# Patient Record
Sex: Female | Born: 1965 | Race: Black or African American | Hispanic: No
Health system: Southern US, Community
[De-identification: ages and names within clinical notes are randomized; demographics above are authoritative.]

## PROBLEM LIST (undated history)

## (undated) VITALS — BP 118/76 | HR 98 | Temp 99.3°F | Resp 18 | Ht 65.0 in | Wt 129.0 lb

## (undated) DIAGNOSIS — F319 Bipolar disorder, unspecified: Secondary | ICD-10-CM

## (undated) DIAGNOSIS — D649 Anemia, unspecified: Secondary | ICD-10-CM

## (undated) DIAGNOSIS — M539 Dorsopathy, unspecified: Secondary | ICD-10-CM

## (undated) DIAGNOSIS — F209 Schizophrenia, unspecified: Secondary | ICD-10-CM

## (undated) DIAGNOSIS — F329 Major depressive disorder, single episode, unspecified: Secondary | ICD-10-CM

## (undated) DIAGNOSIS — F419 Anxiety disorder, unspecified: Secondary | ICD-10-CM

## (undated) DIAGNOSIS — F32A Depression, unspecified: Secondary | ICD-10-CM

## (undated) DIAGNOSIS — F1410 Cocaine abuse, uncomplicated: Secondary | ICD-10-CM

## (undated) HISTORY — DX: Major depressive disorder, single episode, unspecified: F32.9

## (undated) HISTORY — DX: Depression, unspecified: F32.A

## (undated) HISTORY — DX: Anemia, unspecified: D64.9

## (undated) HISTORY — DX: Anxiety disorder, unspecified: F41.9

---

## 1990-09-11 HISTORY — PX: TUBAL LIGATION: SHX77

## 2010-08-10 ENCOUNTER — Emergency Department (HOSPITAL_COMMUNITY)
Admission: EM | Admit: 2010-08-10 | Discharge: 2010-08-10 | Payer: Self-pay | Source: Home / Self Care | Admitting: Emergency Medicine

## 2010-09-22 ENCOUNTER — Ambulatory Visit
Admission: RE | Admit: 2010-09-22 | Discharge: 2010-09-22 | Payer: Self-pay | Source: Home / Self Care | Attending: Obstetrics and Gynecology | Admitting: Obstetrics and Gynecology

## 2010-09-22 ENCOUNTER — Encounter: Payer: Self-pay | Admitting: Obstetrics & Gynecology

## 2010-09-22 ENCOUNTER — Other Ambulatory Visit
Admission: RE | Admit: 2010-09-22 | Discharge: 2010-09-22 | Payer: Self-pay | Source: Home / Self Care | Admitting: Obstetrics and Gynecology

## 2010-09-22 LAB — CONVERTED CEMR LAB
HCT: 34.4 % — ABNORMAL LOW (ref 36.0–46.0)
HCV Ab: NEGATIVE
HIV: NONREACTIVE
Hemoglobin: 11.1 g/dL — ABNORMAL LOW (ref 12.0–15.0)
Hepatitis B Surface Ag: NEGATIVE
MCHC: 32.3 g/dL (ref 30.0–36.0)
MCV: 77.3 fL — ABNORMAL LOW (ref 78.0–100.0)
Platelets: 280 10*3/uL (ref 150–400)
Prolactin: 9.1 ng/mL
RBC: 4.45 M/uL (ref 3.87–5.11)
RDW: 17.6 % — ABNORMAL HIGH (ref 11.5–15.5)
TSH: 1.389 microintl units/mL (ref 0.350–4.500)
WBC: 5.8 10*3/uL (ref 4.0–10.5)

## 2010-09-23 ENCOUNTER — Ambulatory Visit (HOSPITAL_COMMUNITY)
Admission: RE | Admit: 2010-09-23 | Discharge: 2010-09-23 | Payer: Self-pay | Source: Home / Self Care | Attending: Obstetrics & Gynecology | Admitting: Obstetrics & Gynecology

## 2010-09-26 LAB — POCT URINALYSIS DIPSTICK
Bilirubin Urine: NEGATIVE
Hgb urine dipstick: NEGATIVE
Ketones, ur: NEGATIVE mg/dL
Nitrite: NEGATIVE
Protein, ur: NEGATIVE mg/dL
Specific Gravity, Urine: 1.015 (ref 1.005–1.030)
Urine Glucose, Fasting: NEGATIVE mg/dL
Urobilinogen, UA: 0.2 mg/dL (ref 0.0–1.0)
pH: 7 (ref 5.0–8.0)

## 2010-09-26 LAB — POCT PREGNANCY, URINE: Preg Test, Ur: NEGATIVE

## 2010-10-04 ENCOUNTER — Ambulatory Visit (HOSPITAL_COMMUNITY)
Admission: RE | Admit: 2010-10-04 | Discharge: 2010-10-04 | Payer: Self-pay | Source: Home / Self Care | Attending: Internal Medicine | Admitting: Internal Medicine

## 2010-10-07 ENCOUNTER — Ambulatory Visit
Admission: RE | Admit: 2010-10-07 | Discharge: 2010-10-07 | Payer: Self-pay | Source: Home / Self Care | Attending: Obstetrics and Gynecology | Admitting: Obstetrics and Gynecology

## 2010-10-08 NOTE — H&P (Signed)
NAMEALIVEAH, GALLANT NO.:  [id number]  MEDICAL RECORD NO.:  [id number]          PATIENT TYPE:  WOC  LOCATION:  [facility]                   FACILITY[facility]  [facility]  PHYSICIAN:  Catalina Antigua, MD     DATE OF BIRTH:  12-12-1965  DATE OF SERVICE:  10/07/2010                          PRE-OP HISTORY & PHYSICAL  This is a 45 year old, G7, P6-0-1-6 with a longstanding history of dysfunctional uterine bleeding who presented today for further management.  The patient states that she normally gets a period every month lasting 6-7 days, but also reports intermenstrual bleeding at times, accompanied with cramping pain.  The patient had an endometrial biopsy performed on September 22, 2010, which demonstrated a secretory endometrium with no evidence of hyperplasia.  She had an ultrasound biopsy performed on September 23, 2010, which demonstrated a uterus measuring 10 x 6 x 7 cm with thickened endometrium with a region measuring 2.5 x 1.2 x 1.8 cm suggestive of endometrial polyp as there was intraluminal fluid present within the endometrial cavity.  There is a small focal fibroid intramurally measuring 1.3 x 1.4 x 1.4 cm.  She had normal right and left-appearing ovaries.  The patient was informed of these results and desired surgical intervention.  PAST MEDICAL HISTORY:  Significant for asthma which is exertional, bronchitis and depression for which she is seeking psychiatric help.  PAST SURGICAL HISTORY:  She has had bilateral tubal ligation.  PAST OBSTETRICAL HISTORY:  She has had 6 full-term vaginal deliveries.  PAST GYNECOLOGIC HISTORY:  She denies any cyst or fibroids or any history of abnormal Pap smear.  SOCIAL HISTORY:  She smokes half a pack a day and she has been smoking since the age of 45.  She reports ongoing marijuana use and she is a former crack user approximately 40 years ago with her last consumption and she denies any alcohol abuse.  FAMILY HISTORY:   Significant for diabetes, heart disease and high blood pressure and a grandmother with pancreatic cancer.  REVIEW OF SYSTEMS:  Otherwise, significant for vasomotor symptoms.  PHYSICAL EXAMINATION:  VITAL SIGNS:  Her blood pressure is 127/86, pulse of 76, weight of 89 kg, height of 65 inches. LUNGS:  Clear to auscultation bilaterally. HEART:  Regular rate and rhythm. ABDOMEN:  Soft, nontender and nondistended. PELVIC:  She had normal external genitalia, normal-appearing vaginal mucosa and cervix.  No abnormal bleeding or discharge.  Bimanual exam, she has approximately 10-week size uterus.  No palpable adnexal masses or tenderness.  ASSESSMENT AND PLAN:  This is a 45 year old, para 6 with history of dysfunctional uterine bleeding.  Results of the endometrial biopsy and ultrasounds were reviewed with the patient.  The patient is interested in surgical intervention.  Medical options were offered.  The patient was counseled regarding endometrial ablation and is interested in proceeding with that intervention.  Risks, benefits and alternatives were explained.  The patient verbalized understanding.  The patient will be scheduled for endometrial ablation using the NovaSure.          ______________________________ Catalina Antigua, MD    PC/MEDQ  D:  10/07/2010  T:  10/08/2010  Job:  [id number]

## 2010-11-02 ENCOUNTER — Encounter (HOSPITAL_COMMUNITY)
Admission: RE | Admit: 2010-11-02 | Discharge: 2010-11-02 | Disposition: A | Discharge status: Home / Self Care | Payer: Medicaid Other | Source: Ambulatory Visit | Attending: Obstetrics and Gynecology | Admitting: Obstetrics and Gynecology

## 2010-11-02 DIAGNOSIS — Z01812 Encounter for preprocedural laboratory examination: Secondary | ICD-10-CM | POA: Insufficient documentation

## 2010-11-02 DIAGNOSIS — Z01818 Encounter for other preprocedural examination: Secondary | ICD-10-CM | POA: Insufficient documentation

## 2010-11-02 LAB — CBC
HCT: 37.8 % (ref 36.0–46.0)
Hemoglobin: 12.1 g/dL (ref 12.0–15.0)
MCH: 25.1 pg — ABNORMAL LOW (ref 26.0–34.0)
MCHC: 32 g/dL (ref 30.0–36.0)
MCV: 78.3 fL (ref 78.0–100.0)
Platelets: 268 10*3/uL (ref 150–400)
RBC: 4.83 MIL/uL (ref 3.87–5.11)
RDW: 17.2 % — ABNORMAL HIGH (ref 11.5–15.5)
WBC: 5 10*3/uL (ref 4.0–10.5)

## 2010-11-09 ENCOUNTER — Other Ambulatory Visit: Payer: Self-pay | Admitting: Obstetrics and Gynecology

## 2010-11-09 ENCOUNTER — Ambulatory Visit (HOSPITAL_COMMUNITY)
Admission: RE | Admit: 2010-11-09 | Discharge: 2010-11-09 | Disposition: A | Discharge status: Home / Self Care | Payer: Medicaid Other | Source: Ambulatory Visit | Attending: Obstetrics and Gynecology | Admitting: Obstetrics and Gynecology

## 2010-11-09 DIAGNOSIS — N925 Other specified irregular menstruation: Secondary | ICD-10-CM

## 2010-11-09 DIAGNOSIS — N938 Other specified abnormal uterine and vaginal bleeding: Secondary | ICD-10-CM | POA: Insufficient documentation

## 2010-11-09 DIAGNOSIS — N949 Unspecified condition associated with female genital organs and menstrual cycle: Secondary | ICD-10-CM | POA: Insufficient documentation

## 2010-11-09 DIAGNOSIS — Z01812 Encounter for preprocedural laboratory examination: Secondary | ICD-10-CM | POA: Insufficient documentation

## 2010-11-09 LAB — PREGNANCY, URINE: Preg Test, Ur: NEGATIVE

## 2010-11-17 NOTE — Op Note (Signed)
  Samantha Nash, Samantha Nash                ACCOUNT NO.:  [id number]  MEDICAL RECORD NO.:  [id number]           PATIENT TYPE:  O  LOCATION:  [facility]                          [facility]  PHYSICIAN:  Catalina Antigua, MD     DATE OF BIRTH:  1966-06-03  DATE OF PROCEDURE:  11/09/2010 DATE OF DISCHARGE:                              OPERATIVE REPORT   PREOPERATIVE DIAGNOSIS:  A 45 year old G7, P6 with history of dysfunctional uterine bleeding.  POSTOPERATIVE DIAGNOSIS:  A 45 year old G7, P6 with history of dysfunctional uterine bleeding.  PROCEDURE:  D and C hysteroscopy, attempted endometrial ablation.  SURGEON:  Catalina Antigua, MD  ASSISTANT:  None.  ANESTHESIA:  General.  IV FLUIDS:  1200 mL.  ESTIMATED BLOOD LOSS:  Minimal.  Specimen collected were endometrial curettings, sent to Pathology.   FINDINGS: uterine cavity sounded to 6.5 cm, narrow uterine cavity with a long cervix was visualized, approximately 1.5 cm intrauterine polyp, mass was also seen, both ostia were visualized.   PROCEDURE: After informed consent was obtained, the patient was taken to the operating room where anesthesia was induced and found to be adequate.  The patient was placed in dorsal lithotomy position and prepped and draped in usual sterile fashion and a speculum was placed in the vagina.  Anterior lip of the cervix was grasped with a single-tooth tenaculum.  The cervix was injected in the circumference of a 1% Nesacaine solution.  The cervix was gently dilated to 4.0 mm through serial insertion of Hegar dilators The hysteroscope was introduced into the uterine cavity with the above- noted findings.  The size #2 Sims curette was then used to perform gentle curettage removing the polypoid mass.  The cervix was further dilated to 8 mm through serial insertion of Hegar dilators.  The NovaSure device was inserted but failed to completely deploy after multiple attempts at repositioning.  The NovaSure  continued to fail to deploy completely.  Endometrial ablation was then aborted.  All instruments were removed from the patient's abdomen.  The patient tolerated the procedure well.  Sponge, lap, and needle count were correct x2.    Catalina Antigua, MD    PC/MEDQ  D:  11/09/2010  T:  11/09/2010  Job:  [id number]  Electronically Signed by Catalina Antigua  on 11/16/2010 12:58:27 PM

## 2010-11-21 ENCOUNTER — Ambulatory Visit: Payer: Medicaid Other | Admitting: Obstetrics and Gynecology

## 2010-11-21 ENCOUNTER — Encounter (INDEPENDENT_AMBULATORY_CARE_PROVIDER_SITE_OTHER): Payer: Self-pay | Admitting: *Deleted

## 2010-11-21 DIAGNOSIS — N925 Other specified irregular menstruation: Secondary | ICD-10-CM

## 2010-11-21 DIAGNOSIS — N949 Unspecified condition associated with female genital organs and menstrual cycle: Secondary | ICD-10-CM

## 2010-11-21 DIAGNOSIS — Z09 Encounter for follow-up examination after completed treatment for conditions other than malignant neoplasm: Secondary | ICD-10-CM

## 2010-11-21 LAB — CONVERTED CEMR LAB
HCT: 35 % — ABNORMAL LOW (ref 36.0–46.0)
Hemoglobin: 11.6 g/dL — ABNORMAL LOW (ref 12.0–15.0)
MCHC: 33.1 g/dL (ref 30.0–36.0)
MCV: 77.1 fL — ABNORMAL LOW (ref 78.0–100.0)
Platelets: 261 10*3/uL (ref 150–400)
RBC: 4.54 M/uL (ref 3.87–5.11)
RDW: 16.9 % — ABNORMAL HIGH (ref 11.5–15.5)
WBC: 4.5 10*3/uL (ref 4.0–10.5)

## 2010-11-23 LAB — COMPREHENSIVE METABOLIC PANEL
ALT: 11 U/L (ref 0–35)
AST: 19 U/L (ref 0–37)
Albumin: 3.3 g/dL — ABNORMAL LOW (ref 3.5–5.2)
Alkaline Phosphatase: 69 U/L (ref 39–117)
BUN: 6 mg/dL (ref 6–23)
CO2: 26 mEq/L (ref 19–32)
Calcium: 8.5 mg/dL (ref 8.4–10.5)
Chloride: 100 mEq/L (ref 96–112)
Creatinine, Ser: 0.85 mg/dL (ref 0.4–1.2)
GFR calc Af Amer: >60 mL/min (ref >60)
GFR calc non Af Amer: >60 mL/min (ref >60)
Glucose, Bld: 105 mg/dL — ABNORMAL HIGH (ref 70–99)
Potassium: 3.5 mEq/L (ref 3.5–5.1)
Sodium: 132 mEq/L — ABNORMAL LOW (ref 135–145)
Total Bilirubin: 0.3 mg/dL (ref 0.3–1.2)
Total Protein: 7.6 g/dL (ref 6.0–8.3)

## 2010-11-23 LAB — POCT PREGNANCY, URINE: Preg Test, Ur: NEGATIVE

## 2010-11-23 LAB — URINE CULTURE
Colony Count: >=100000
Culture  Setup Time: 201111302104

## 2010-11-23 LAB — URINALYSIS, ROUTINE W REFLEX MICROSCOPIC
Bilirubin Urine: NEGATIVE
Glucose, UA: NEGATIVE mg/dL
Ketones, ur: NEGATIVE mg/dL
Nitrite: POSITIVE — AB
Protein, ur: NEGATIVE mg/dL
Specific Gravity, Urine: 1.013 (ref 1.005–1.030)
Urobilinogen, UA: 1 mg/dL (ref 0.0–1.0)
pH: 6.5 (ref 5.0–8.0)

## 2010-11-23 LAB — CBC
HCT: 33.9 % — ABNORMAL LOW (ref 36.0–46.0)
Hemoglobin: 11.3 g/dL — ABNORMAL LOW (ref 12.0–15.0)
MCH: 26.2 pg (ref 26.0–34.0)
MCHC: 33.3 g/dL (ref 30.0–36.0)
MCV: 78.5 fL (ref 78.0–100.0)
Platelets: 196 10*3/uL (ref 150–400)
RBC: 4.32 MIL/uL (ref 3.87–5.11)
RDW: 16.3 % — ABNORMAL HIGH (ref 11.5–15.5)
WBC: 6.2 10*3/uL (ref 4.0–10.5)

## 2010-11-23 LAB — DIFFERENTIAL
Basophils Absolute: 0 10*3/uL (ref 0.0–0.1)
Basophils Relative: 0 % (ref 0–1)
Eosinophils Absolute: 0.1 10*3/uL (ref 0.0–0.7)
Eosinophils Relative: 1 % (ref 0–5)
Lymphocytes Relative: 16 % (ref 12–46)
Lymphs Abs: 1 10*3/uL (ref 0.7–4.0)
Monocytes Absolute: 0.8 10*3/uL (ref 0.1–1.0)
Monocytes Relative: 13 % — ABNORMAL HIGH (ref 3–12)
Neutro Abs: 4.4 10*3/uL (ref 1.7–7.7)
Neutrophils Relative %: 71 % (ref 43–77)

## 2010-11-23 LAB — URINE MICROSCOPIC-ADD ON

## 2010-11-23 LAB — LIPASE, BLOOD: Lipase: 30 U/L (ref 11–59)

## 2010-12-02 NOTE — Progress Notes (Signed)
Samantha Nash, Samantha Nash                ACCOUNT NO.:  [id number]  MEDICAL RECORD NO.:  [id number]           PATIENT TYPE:  A  [facility]ATION[facility]                   [facility]  PHYSICIAN:  Catalina Antigua, MD     DATE OF BIRTH:  02-24-1966  DATE OF SERVICE:  11/21/2010                                 CLINIC NOTE  This is a 45 year old G7, P6-0-1-6 who is status post attempted endometrial ablation on November 09, 2010, with D and C hysteroscopy, polypectomy, who presents today for postoperative check.  The patient states that she has been doing fairly well postoperatively but on November 18, 2010, started to have heavy periods.  The patient feels that it might be too early for her normal onset of her menses, states that she has never had heavy bleeding as such.  The patient is otherwise without any complaints, denies any dizziness, lightheadedness, or chest pain or difficulty breathing.  PHYSICAL EXAMINATION:  VITAL SIGNS:  Her blood pressure is 145/93, pulse of 71, weight of 196.5 pounds, temperature of 98.4. ABDOMEN:  Soft, nontender, nondistended, but obese. PELVIC:  Showed normal vaginal mucosa and cervix.  Quarter-sized blood clot was visualized in the vaginal vault and a slow ooze was coming from the cervical os.  ASSESSMENT AND PLAN:  This is a 45 year old who is status post D and C, hysteroscopy, polypectomy, and attempted endometrial ablation on November 09, 2010, who presents today for postoperative check.  Results of pathology were reviewed with the patient as well as the failure to perform the endometrial ablation.  The patient verbalized understanding. The patient was counseled on the need for a hysterectomy in the future if she desires surgical intervention for the management of her dysfunctional uterine bleeding.  In the meantime, the patient was provided prescription for Provera 10 mg p.o. b.i.d. for 10 days with 1 refill.  The patient will return in 2 months or  p.r.n.          ______________________________ Catalina Antigua, MD    PC/MEDQ  D:  11/21/2010  T:  11/22/2010  Job:  [id number]

## 2011-07-10 ENCOUNTER — Ambulatory Visit: Payer: Medicaid Other | Admitting: Obstetrics and Gynecology

## 2011-08-23 ENCOUNTER — Ambulatory Visit: Payer: Medicaid Other | Admitting: Obstetrics & Gynecology

## 2011-09-12 IMAGING — US US TRANSVAGINAL NON-OB
1 series · 13 of 25 positions shown · non-contrast
Comparison: None.

CLINICAL DATA: Enlarged uterus with dysfunctional uterine bleeding.
Endometrial biopsy on 09/22/2010.  LMP 09/05/2010



[Series 1: us pelvis complete modify · 13 of 73 slices shown]
[im 1/73]
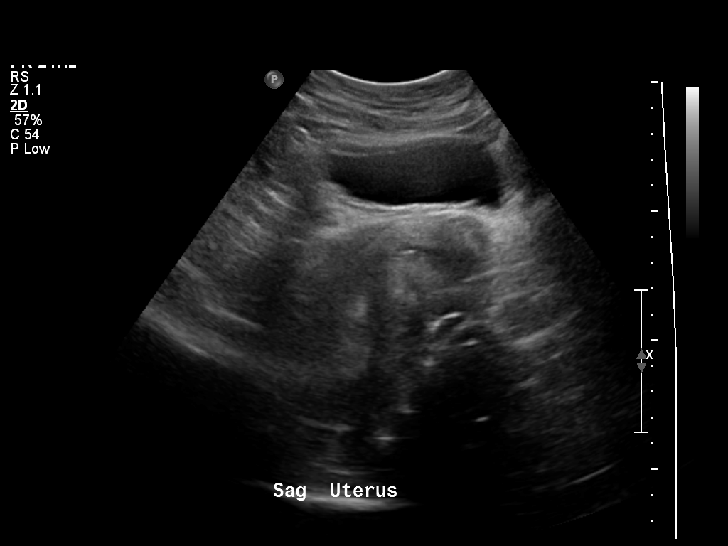
[im 7/73]
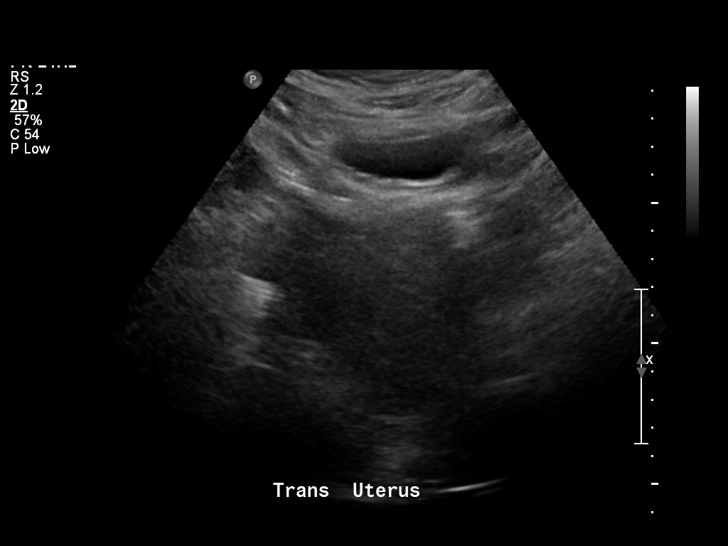
[im 13/73]
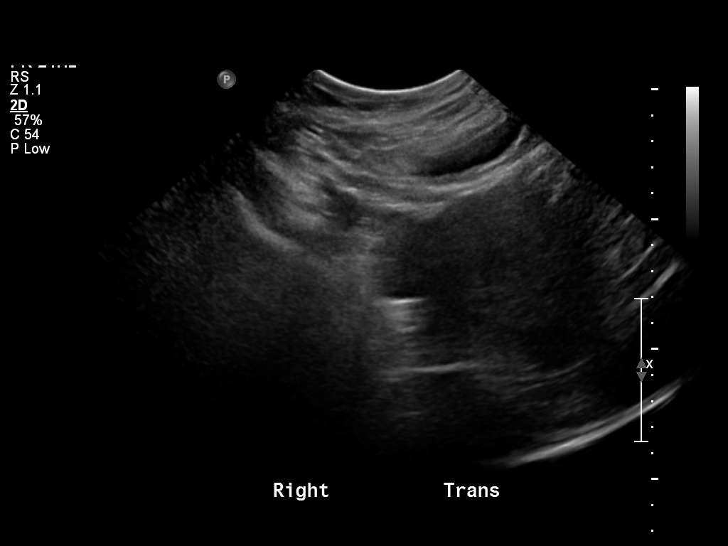
[im 19/73]
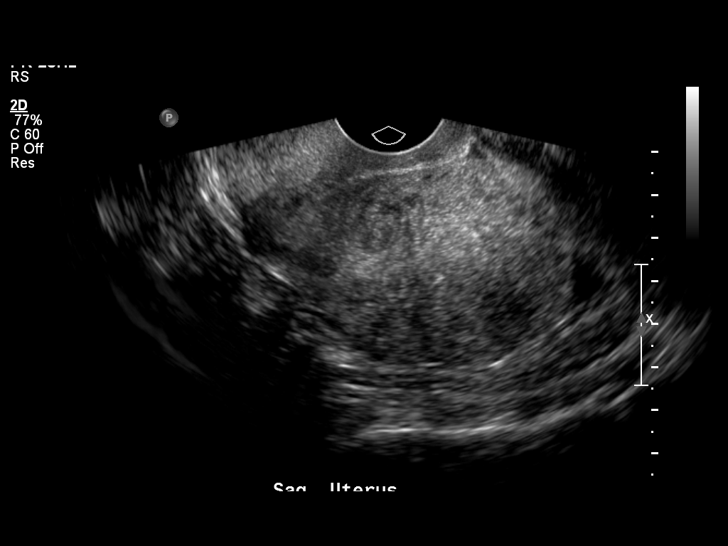
[im 25/73]
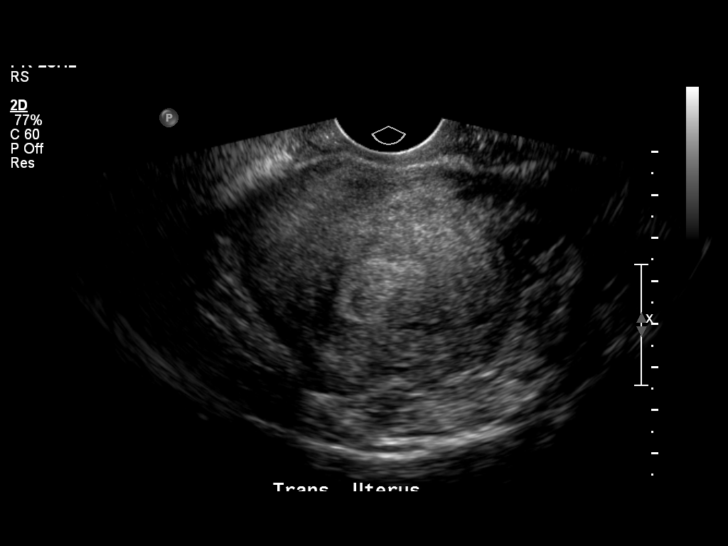
[im 31/73]
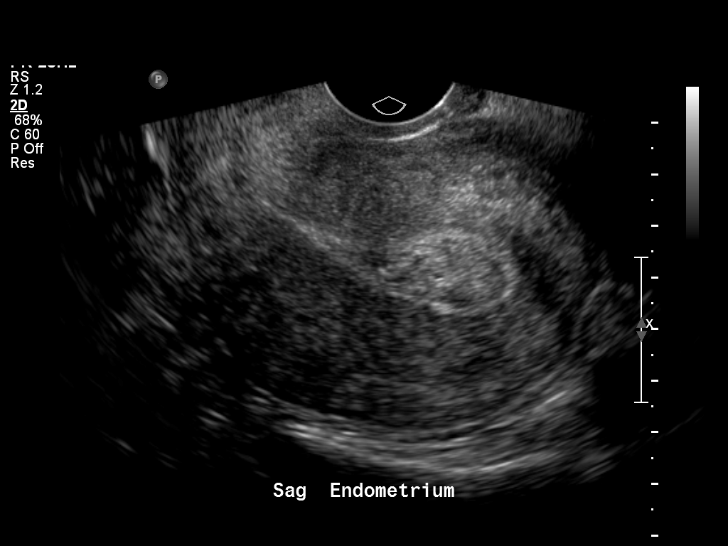
[im 37/73]
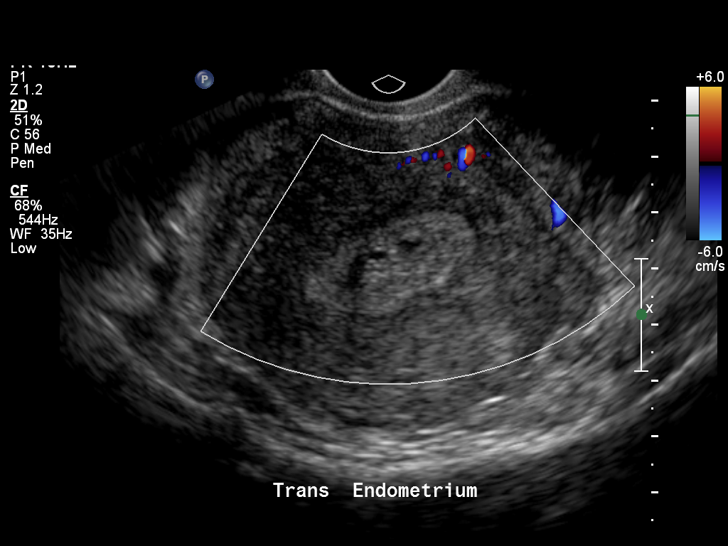
[im 43/73]
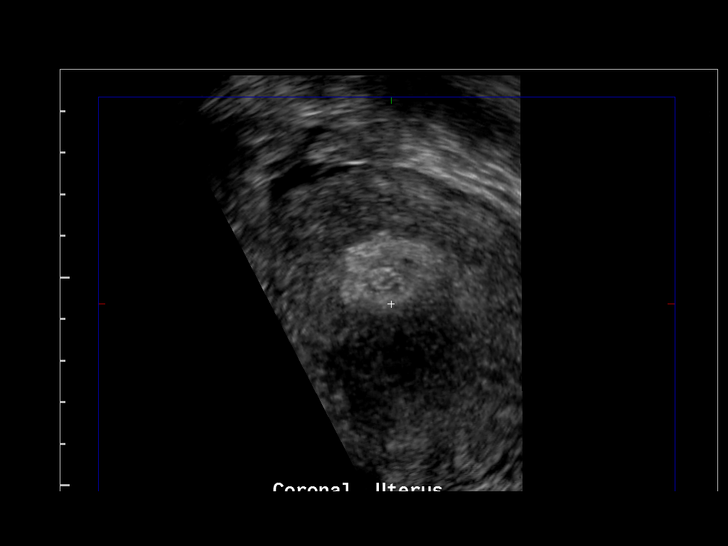
[im 49/73]
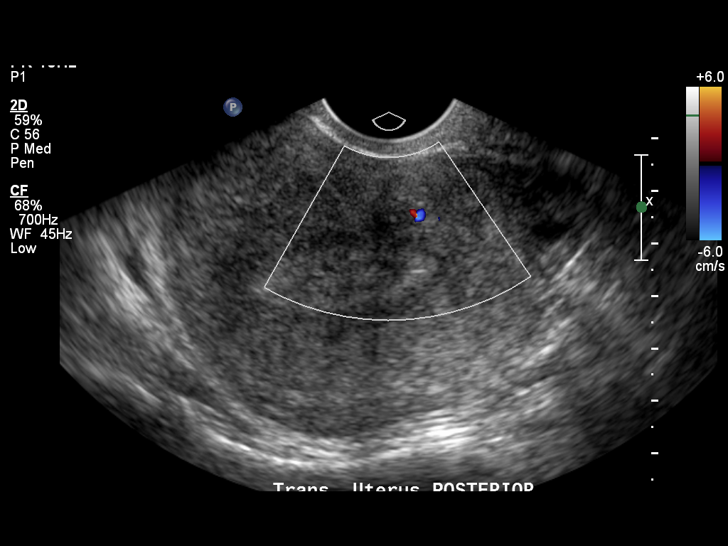
[im 55/73]
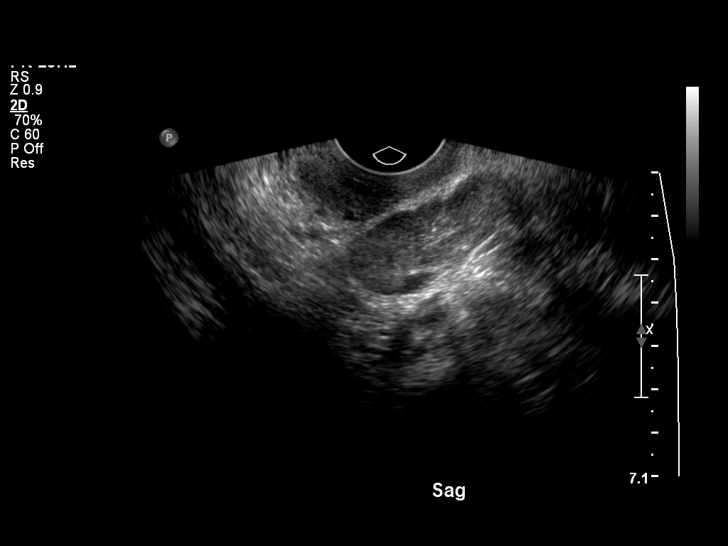
[im 61/73]
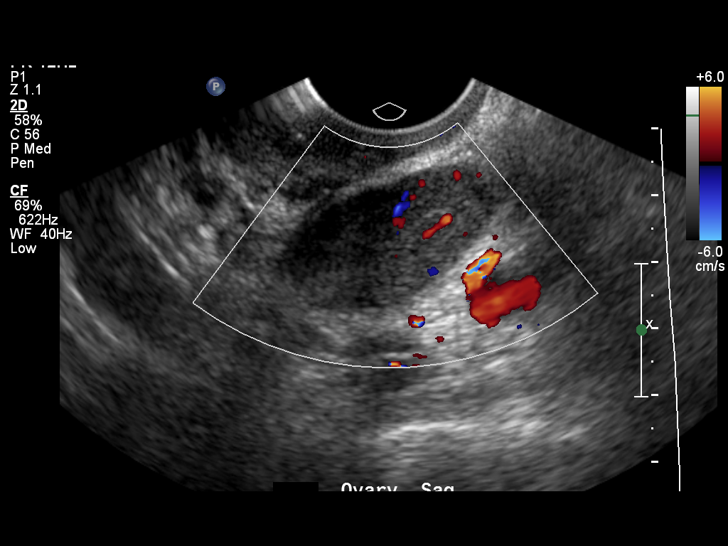
[im 67/73]
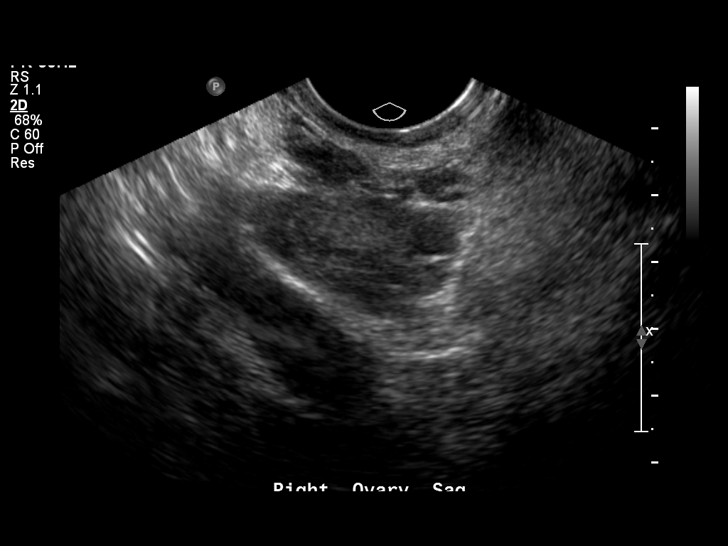
[im 73/73]
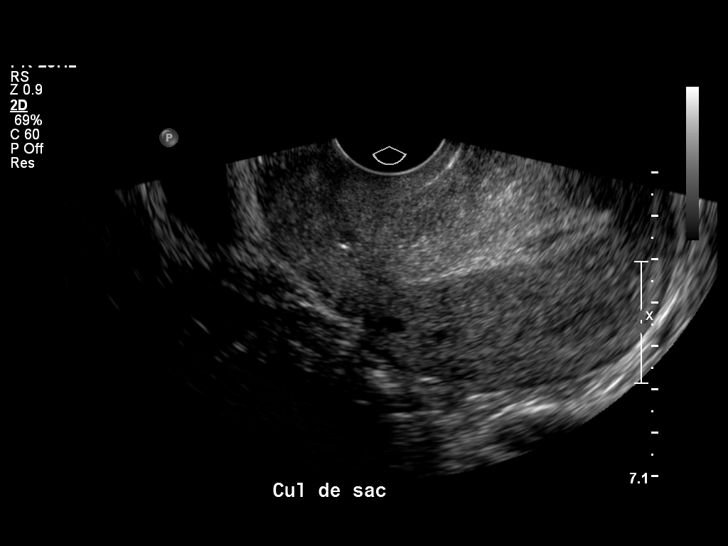

[13 of 25 positions shown; findings below may reference images not displayed]

FINDINGS: Uterus the uterus is retroverted and demonstrates a sagittal length
of 10.1 cm, an AP depth of 6.0 cm and a transverse width of 7.0 cm.
A homogeneous uterine myometrium is noted.

Endometrium demonstrates an area of focal thickening in the region
of the fundus measuring 2.5 x 1.2 by 1.8 cm.  This demonstrates
intralesional flow and appears to have a feeding vessel emanating
from the left lateral portion of the wall.  The appearance is most
suggestive of an intraluminal polyp.  A small amount of fluid is
identified surrounding a portion of this intracavitary soft tissue
mass.  The visualized portion of the adjacent endometrial lining
outlined by the intraluminal fluid is thin with a single layer
measurement of 2.7 mm.  A small focal fibroid is identified which
is mural measuring 1.3 x 1.4 x 1.4 cm in the left lateral lower
uterine segment.

Right Ovary has a normal appearance measuring 3.1 x 1.7 x 1.6 cm

Left Ovary has a normal appearance measuring 2.0 x 3.5 x 1.9 cm.

Other Findings:  No pelvic fluid or separate adnexal masses are
noted.
IMPRESSION: Focal area of endometrial thickening with associated intra vascular
flow and cyst addition of a feeding vessel.  The sonographic
appearance is most suspicious for a focal polyp.  A small amount of
intraendometrial fluid is present likely due to the recent
endometrial biopsy.  This  impression can be confirmed with sono
hysterography if desired

Small focal fibroid.  Normal ovaries

## 2011-10-11 ENCOUNTER — Ambulatory Visit (INDEPENDENT_AMBULATORY_CARE_PROVIDER_SITE_OTHER): Payer: Medicaid Other | Admitting: Advanced Practice Midwife

## 2011-10-11 VITALS — BP 128/84 | HR 82 | Temp 98.1°F | Ht 66.0 in | Wt 164.0 lb

## 2011-10-11 DIAGNOSIS — N938 Other specified abnormal uterine and vaginal bleeding: Secondary | ICD-10-CM

## 2011-10-11 DIAGNOSIS — N898 Other specified noninflammatory disorders of vagina: Secondary | ICD-10-CM | POA: Insufficient documentation

## 2011-10-11 DIAGNOSIS — F419 Anxiety disorder, unspecified: Secondary | ICD-10-CM

## 2011-10-11 DIAGNOSIS — F329 Major depressive disorder, single episode, unspecified: Secondary | ICD-10-CM

## 2011-10-11 DIAGNOSIS — F32A Depression, unspecified: Secondary | ICD-10-CM

## 2011-10-11 DIAGNOSIS — N926 Irregular menstruation, unspecified: Secondary | ICD-10-CM

## 2011-10-11 DIAGNOSIS — N899 Noninflammatory disorder of vagina, unspecified: Secondary | ICD-10-CM

## 2011-10-11 DIAGNOSIS — F411 Generalized anxiety disorder: Secondary | ICD-10-CM

## 2011-10-11 DIAGNOSIS — R300 Dysuria: Secondary | ICD-10-CM

## 2011-10-11 DIAGNOSIS — F17200 Nicotine dependence, unspecified, uncomplicated: Secondary | ICD-10-CM

## 2011-10-11 LAB — POCT URINALYSIS DIP (DEVICE)
Bilirubin Urine: NEGATIVE
Glucose, UA: NEGATIVE mg/dL
Hgb urine dipstick: NEGATIVE
Ketones, ur: NEGATIVE mg/dL
Nitrite: NEGATIVE
Protein, ur: NEGATIVE mg/dL
Specific Gravity, Urine: <=1.005 (ref 1.005–1.030)
Urobilinogen, UA: 0.2 mg/dL (ref 0.0–1.0)
pH: 5 (ref 5.0–8.0)

## 2011-10-11 MED ORDER — FLUCONAZOLE 150 MG PO TABS: 150.0000 mg | ORAL_TABLET | Freq: Once | ORAL | Status: AC

## 2011-10-11 NOTE — Assessment & Plan Note (Signed)
Checking GC/Chlamydia, wet prep, and urine culture (urinalysis shows small leukocytes).  Appears to be yeast infection. Will treat with Diflucan. Patient notified that if anything other than yeasts, will receive call and appropriate Rx.

## 2011-10-11 NOTE — Progress Notes (Signed)
  Subjective:    Patient ID: Samantha Nash, female    DOB: Jan 20, 1966, 46 y.o.   MRN: [id number]  HPI Patient complaining of vaginal itching and lower abdominal cramping for the past week. Thick white vaginal discharge. Worse with intercourse. Denies abnormal vaginal bleeding.  Last intercourse 7 days ago.  Last period earlier this month. Normal monthly period for her.   Review of Systems Denies fevers/chills. Denies dysuria.    Objective:   Physical Exam Gen: NAD Abd: NABS, soft, mild TTP lower abdomen without guarding or rebound, no distension GU:   Vulva: normal without lesions   Vagina: moderate tenderness with speculum exam; thick white discharge posterior vault   Cervix: without lesions or cervical motion tenderness     Assessment & Plan:

## 2011-10-12 DIAGNOSIS — F17200 Nicotine dependence, unspecified, uncomplicated: Secondary | ICD-10-CM | POA: Insufficient documentation

## 2011-10-12 DIAGNOSIS — N938 Other specified abnormal uterine and vaginal bleeding: Secondary | ICD-10-CM | POA: Insufficient documentation

## 2011-10-12 LAB — WET PREP, GENITAL
Trich, Wet Prep: NONE SEEN
Yeast Wet Prep HPF POC: NONE SEEN

## 2011-10-12 LAB — GC/CHLAMYDIA PROBE AMP, GENITAL
Chlamydia, DNA Probe: NEGATIVE
GC Probe Amp, Genital: NEGATIVE

## 2011-10-13 ENCOUNTER — Telehealth: Payer: Self-pay | Admitting: *Deleted

## 2011-10-13 LAB — URINE CULTURE: Colony Count: >=100000

## 2011-10-13 MED ORDER — METRONIDAZOLE 500 MG PO TABS: 500.0000 mg | ORAL_TABLET | Freq: Two times a day (BID) | ORAL | Status: AC

## 2011-10-13 NOTE — Telephone Encounter (Signed)
Pt left a voice message on nurse call line requesting her results from her visit on 10/11/11.

## 2011-10-13 NOTE — Telephone Encounter (Signed)
Results reviewed and Rx order received from Dr. Macon Large.  Call placed to pt, however no answer and unable to leave voice mail message.  Pt may be told that her wet prep was + for BV, Rx sent to pharmacy.

## 2011-10-16 ENCOUNTER — Telehealth: Payer: Self-pay

## 2011-10-16 MED ORDER — CIPROFLOXACIN HCL 500 MG PO TABS: 500.0000 mg | ORAL_TABLET | Freq: Two times a day (BID) | ORAL | Status: AC

## 2011-10-16 NOTE — Telephone Encounter (Signed)
Spoke w/pt- informed of +BV and treatment needed. Pt also informed that GC/Chlamydia test was negative. Pt voiced understanding.

## 2011-10-16 NOTE — Telephone Encounter (Signed)
Pt called and wanted results of her 10/11/11 visit.  I informed pt that she has a UTI and BV.  Informed pt that antibiotics has been sent to her pharmacy in which was verified with the pt.

## 2011-11-03 ENCOUNTER — Ambulatory Visit: Payer: Medicaid Other | Admitting: Obstetrics and Gynecology

## 2012-03-05 ENCOUNTER — Encounter (HOSPITAL_COMMUNITY): Payer: Self-pay | Admitting: Emergency Medicine

## 2012-03-05 ENCOUNTER — Emergency Department (HOSPITAL_COMMUNITY)
Admission: EM | Admit: 2012-03-05 | Discharge: 2012-03-05 | Disposition: A | Discharge status: Home / Self Care | Payer: Self-pay | Attending: Emergency Medicine | Admitting: Emergency Medicine

## 2012-03-05 ENCOUNTER — Emergency Department (HOSPITAL_COMMUNITY): Payer: Self-pay

## 2012-03-05 DIAGNOSIS — F411 Generalized anxiety disorder: Secondary | ICD-10-CM | POA: Insufficient documentation

## 2012-03-05 DIAGNOSIS — F1410 Cocaine abuse, uncomplicated: Secondary | ICD-10-CM | POA: Insufficient documentation

## 2012-03-05 DIAGNOSIS — F17200 Nicotine dependence, unspecified, uncomplicated: Secondary | ICD-10-CM | POA: Insufficient documentation

## 2012-03-05 DIAGNOSIS — G8929 Other chronic pain: Secondary | ICD-10-CM | POA: Insufficient documentation

## 2012-03-05 DIAGNOSIS — F319 Bipolar disorder, unspecified: Secondary | ICD-10-CM | POA: Insufficient documentation

## 2012-03-05 DIAGNOSIS — Z79899 Other long term (current) drug therapy: Secondary | ICD-10-CM | POA: Insufficient documentation

## 2012-03-05 DIAGNOSIS — X58XXXA Exposure to other specified factors, initial encounter: Secondary | ICD-10-CM | POA: Insufficient documentation

## 2012-03-05 DIAGNOSIS — R079 Chest pain, unspecified: Secondary | ICD-10-CM | POA: Insufficient documentation

## 2012-03-05 DIAGNOSIS — IMO0002 Reserved for concepts with insufficient information to code with codable children: Secondary | ICD-10-CM | POA: Insufficient documentation

## 2012-03-05 DIAGNOSIS — S39012A Strain of muscle, fascia and tendon of lower back, initial encounter: Secondary | ICD-10-CM

## 2012-03-05 DIAGNOSIS — J45909 Unspecified asthma, uncomplicated: Secondary | ICD-10-CM | POA: Insufficient documentation

## 2012-03-05 DIAGNOSIS — M545 Low back pain, unspecified: Secondary | ICD-10-CM | POA: Insufficient documentation

## 2012-03-05 DIAGNOSIS — R0602 Shortness of breath: Secondary | ICD-10-CM | POA: Insufficient documentation

## 2012-03-05 HISTORY — DX: Dorsopathy, unspecified: M53.9

## 2012-03-05 HISTORY — DX: Cocaine abuse, uncomplicated: F14.10

## 2012-03-05 HISTORY — DX: Bipolar disorder, unspecified: F31.9

## 2012-03-05 LAB — POCT I-STAT TROPONIN I: Troponin i, poc: 0.01 ng/mL (ref 0.00–0.08)

## 2012-03-05 MED ORDER — OXYCODONE-ACETAMINOPHEN 5-325 MG PO TABS
1.0000 | ORAL_TABLET | Freq: Once | ORAL | Status: AC
  Administered 2012-03-05: 1 via ORAL
  Filled 2012-03-05: qty 1

## 2012-03-05 MED ORDER — OXYCODONE-ACETAMINOPHEN 10-325 MG PO TABS: 1.0000 | ORAL_TABLET | Freq: Two times a day (BID) | ORAL | Status: DC | PRN

## 2012-03-05 NOTE — Discharge Instructions (Signed)
Follow up with your doctor. If you do not have a doctor right now, as it was provided at the bottom of this page.   See your doctor immediately--or return to the ED--with any new or troubling symptoms including fevers, weakness, new chest pain, shortness or breath, numbness, or any other concerning symptom.    Back Pain, Adult  Low back pain is very common. About 1 in 5 people have back pain.The cause of low back pain is rarely dangerous. The pain often gets better over time.About half of people with a sudden onset of back pain feel better in just 2 weeks. About 8 in 10 people feel better by 6 weeks.  CAUSES Some common causes of back pain include:  Strain of the muscles or ligaments supporting the spine.   Wear and tear (degeneration) of the spinal discs.   Arthritis.   Direct injury to the back.  DIAGNOSIS Most of the time, the direct cause of low back pain is not known.However, back pain can be treated effectively even when the exact cause of the pain is unknown.Answering your caregiver's questions about your overall health and symptoms is one of the most accurate ways to make sure the cause of your pain is not dangerous. If your caregiver needs more information, he or she may order lab work or imaging tests (X-rays or MRIs).However, even if imaging tests show changes in your back, this usually does not require surgery. HOME CARE INSTRUCTIONS For many people, back pain returns.Since low back pain is rarely dangerous, it is often a condition that people can learn to [facility] their own.   Remain active. It is stressful on the back to sit or stand in one place. Do not sit, drive, or stand in one place for more than 30 minutes at a time. Take short walks on level surfaces as soon as pain allows.Try to increase the length of time you walk each day.   Do not stay in bed.Resting more than 1 or 2 days can delay your recovery.   Do not avoid exercise or work.Your body is made to move.It  is not dangerous to be active, even though your back may hurt.Your back will likely heal faster if you return to being active before your pain is gone.   Pay attention to your body when you bend and lift. Many people have less discomfortwhen lifting if they bend their knees, keep the load close to their bodies,and avoid twisting. Often, the most comfortable positions are those that put less stress on your recovering back.   Find a comfortable position to sleep. Use a firm mattress and lie on your side with your knees slightly bent. If you lie on your back, put a pillow under your knees.   Only take over-the-counter or prescription medicines as directed by your caregiver. Over-the-counter medicines to reduce pain and inflammation are often the most helpful.Your caregiver may prescribe muscle relaxant drugs.These medicines help dull your pain so you can more quickly return to your normal activities and healthy exercise.   Put ice on the injured area.   Put ice in a plastic bag.   Place a towel between your skin and the bag.   Leave the ice on for 15 to 20 minutes, 3 to 4 times a day for the first 2 to 3 days. After that, ice and heat may be alternated to reduce pain and spasms.   Ask your caregiver about trying back exercises and gentle massage. This may be of  some benefit.   Avoid feeling anxious or stressed.Stress increases muscle tension and can worsen back pain.It is important to recognize when you are anxious or stressed and learn ways to manage it.Exercise is a great option.  SEEK MEDICAL CARE IF:  You have pain that is not relieved with rest or medicine.   You have pain that does not improve in 1 week.   You have new symptoms.   You are generally not feeling well.  SEEK IMMEDIATE MEDICAL CARE IF:   You have pain that radiates from your back into your legs.   You develop new bowel or bladder control problems.   You have unusual weakness or numbness in your arms or legs.    You develop nausea or vomiting.   You develop abdominal pain.   You feel faint.  Document Released: 08/28/2005 Document Revised: 08/17/2011 Document Reviewed: 01/16/2011 [facility] Patient Information 2012 [place], [place].  RESOURCE GUIDE  Chronic Pain Problems: Contact [facility]  [phone] Patients need to be referred by their primary care doctor.  Insufficient Money for Medicine: Contact United Way:  call "211" or [facility] [phone].  No Primary Care Doctor: - Call [facility]  [phone] - can help you locate a primary care doctor that  accepts your insurance, provides certain services, etc. - Physician Referral Service- [phone]  Agencies that provide inexpensive medical care: - [facility]  [phone] - [facility]  [phone] - [facility]  [phone] - Women's Clinic  [phone] - Planned Parenthood  [phone] [facility]  [phone]  Medicaid-accepting [facility] Providers: - [facility]- [address]  [phone], Mon-Fri 9am-7pm, Sat 9am-1pm - [facility]- [address] [place], [address] [place]  [phone] - [facility]- [address] [place]  [phone] [facility]- [address]  [phone] - Renaye Rakers- [address] [place], [address], [phone]  Only accepts [place] patients after they have their name  applied to their card  Self Pay (no insurance) in [place]: - Sickle Cell Patients: Dr Willey Blade, [facility] Internal Medicine  [address] [place], [phone] - [facility]- [address] [place]  [phone]       [facility]- [address]       -     [facility]- see information above (Speak to [employer] if you do not have insurance)       -  [facility]- [address] [place], [phone]        -  [facility]- [address],  [phone]       -  [facility]- [address], [phone]       -  Dr Julio Sicks-  [address], [place], [phone]       -  [facility]- [address], [phone]       -  [facility]- [address] [phone], also [address] [phone]       -    [facility]- [address] [place], [phone], 1st & 3rd Saturday   every month, 10am-1pm  1) Find a Doctor and Pay Out of Pocket Although you won't have to find out  who is covered by your insurance plan, it is a good idea to ask around and get recommendations. You will then need to call the office and see if the doctor you have chosen will accept you as a new patient and what types of options they offer for patients who are self-pay. Some doctors offer discounts or will set up payment plans for their patients who do not have insurance, but you will need to ask so you aren't surprised when you get to your appointment.  2) Contact Your Local Health Department Not all health departments have doctors that can see patients for sick visits, but many do, so it is worth a call to see if yours does. If you don't know where your local health department is, you can check in your phone book. The CDC also has a tool to help you locate your state's health department, and many state websites also have listings of all of their local health departments.  3) Find a Walk-in Clinic If your illness is not likely to be very severe or complicated, you may want to try a walk in clinic. These are popping up all over the country in pharmacies, drugstores, and shopping centers. They're usually staffed by nurse practitioners or physician assistants that have been trained to treat common illnesses and complaints. They're usually fairly quick and inexpensive. However, if you have serious medical issues or chronic medical problems, these are probably not your best  option  STD Testing - [facility] Department of [facility], STD Clinic, [address], [place], phone [phone] or [phone].  Monday - Friday, call for an appointment. [facility] [place] [facility] of [employer], STD Clinic, [address], [place], phone [phone] or [phone].  Monday - Friday, call for an appointment.  Abuse/Neglect: [facility] Child Abuse Hotline (760)852-6879 [facility] Child Abuse Hotline 850-866-4418 (After Hours)  Emergency Shelter:  [facility] (515) [phone]  Maternity Homes: - [facility] [facility] (337)186-2245 - [facility] [phone]  MRSA Hotline #:   316-060-7545  [facility] Resources  [facility] [place]  United Way [facility] Dept. [address]                 [address]         [address] [place] [address]  Blondell Reveal Phone:  [phone]                                  Phone:  [phone]                   Phone:  [phone]  [facility] Mental Health, [phone] - [facility]([phone]       -     [facility] in [place], [address],  [phone], [facility] Child Abuse Hotline 276-456-1802 or (769)576-0474 (After Hours)   Behavioral Health Services  Substance Abuse Resources: - [facility] and Drug Services  (337)192-8486 - [facility] (567) 118-8586 - [facility] [phone] Floydene Flock [phone] - Residential & Outpatient Substance Abuse Program  (774)[phone]  Psychological Services: [facility]  [phone] [facility] Services  ([phone] - [facility], [phone] [place]. [address], [place], ACCESS LINE: (863)545-7595 or  (802)765-7198, [employer]za  Dental Assistance  If unable to pay or uninsured, contact:  [facility] or [facility] to become qualified for the adult dental clinic.  Patients with Medicaid: [facility] [phone] [address], [phone] [address] [phone]  If unable to pay, or uninsured, contact [facility] [phone]) or [facility] Department [phone] in [place], [phone] in [place] [facility]) to become qualified for the adult dental clinic  Other Low-Cost Community Dental Services: - [facility]- [address] [place], [place], [place], [address], [phone], [address], 2nd and 4th Thursday of the month at 6:30am.  10 clients each day by appointment, can sometimes see walk-in patients if someone does not show for an appointment. [facility]- [address] [place], [place], [address], [phone] - [facility]- [address], [place], [place], [address], [phone] - [facility]- [phone] [facility] Health Department- [phone] [facility] Department- [phone]

## 2012-03-05 NOTE — ED Notes (Signed)
Patient transported to X-ray 

## 2012-03-05 NOTE — ED Notes (Signed)
Pt was brought in by EMS. Pt states she has been having SOB and Chest pain for two hours. Pt states she has Asthma and feels like it might be a flair up. Pt admits to using Cocaine 2 days ago. Pt states pain 8/10. Pt states pain starts on the left side of back and radiates to chest. Pt was found in [facility] by EMS.

## 2012-03-05 NOTE — ED Provider Notes (Signed)
I have seen and examined this patient with the resident.  I agree with the resident's note, assessment and plan except as indicated.     Date: 03/05/2012  Rate: 86  Rhythm: normal sinus rhythm  QRS Axis: normal  Intervals: normal  ST/T Wave abnormalities: normal  Conduction Disutrbances:none  Narrative Interpretation:   Old EKG Reviewed: none available  Patient with left-sided back pain without trauma.  She felt some shortness of breath which has resolved now.  Her lungs are clear an exam and she has oxygen saturations of 100% on room air.  She's not demonstrating any tachypnea or tachycardia on my exam.  This is not appear to be consistent with pulmonary embolus given patient's lack of risk factors and normal vital signs.  She will have a chest x-ray obtained to rule out other lung process but pneumonia seems less likely.  Lesions EKG is normal and without signs of ischemia this time.  Patient's pain seems worse just shifting in bed which may support that this is more musculoskeletal.    Nat Christen, MD 03/05/12 1540

## 2012-03-05 NOTE — ED Notes (Signed)
EMS states they gave patient 1 Nitro that resolved patient pain from 10 to 0. Also 324 ASA given to patient. Pt arrived on 2 L of O2 sats at 100%

## 2012-03-05 NOTE — ED Provider Notes (Signed)
History     CSN: [id number]  Arrival date & time 03/05/12  1414   First MD Initiated Contact with Patient 03/05/12 1507      Chief Complaint  Patient presents with  . Chest Pain  . Back Pain  . Shortness of Breath    (Consider location/radiation/quality/duration/timing/severity/associated sxs/prior treatment) HPI   46 year old female past medical history of chronic low back pain, cocaine abuse, bipolar disorder unmedicated for at least 2 months, asthma unmedicated for 2 months, anxiety and depression presenting with a three-hour history of waxing and waning left back tightness with some shortness of breath that she roughly associates with her wheezing. She did not have an inhaler at this time to use. She was walking up a hill when she first noticed it. The discomfort was 5/10. As mentioned above it was a sense of tightness. She felt short of breath with it. When she arrived at her destination, she rested and felt better however did not resolve completely. She still felt wheezy.  Then it got somewhat worse at rest. Her shortness of breath has been ongoing. According to EMS which she eventually called, her oxygen saturations were normal. She was told this was not wheezing. She came to the emergency department. On arrival her vital signs are stable. She calls her illness moderate. Walking makes worse, nothing else has made it better.  Her discomfort remains 5/10.  Movement makes it worse to 8/10.  Past Medical History  Diagnosis Date  . Anemia   . Depression   . Anxiety   . Asthma   . Cocaine abuse   . Bipolar 1 disorder   . Back problem     Past Surgical History  Procedure Date  . Tubal ligation 1992    Family History  Problem Relation Age of Onset  . Diabetes Mother   . Kidney disease Mother     kidney failure  . Thrombocytopenia Mother   . Depression Father   . Alcohol abuse Father   . Mental illness Father   . Heart failure Son     History  Substance Use Topics  .  Smoking status: Current Everyday Smoker -- 0.5 packs/day for 30 years    Types: Cigarettes  . Smokeless tobacco: Never Used  . Alcohol Use: Yes     socially    OB History    Grav Para Term Preterm Abortions TAB SAB Ect Mult Living   7 6 6  1  1   5       Review of Systems Constitutional: Negative for fever and chills.  HENT: Negative for ear pain, sore throat and trouble swallowing.   Eyes: Negative for pain and visual disturbance.  Respiratory: Negative for cough and POS shortness of breath.   Cardiovascular: POS for chest tightness, neg pain and leg swelling.  Gastrointestinal: Negative for nausea, vomiting, abdominal pain and diarrhea.  Genitourinary: Negative for dysuria, urgency and frequency.  Musculoskeletal: POS for L middle back symjptoms as above; also her regular lower back pain (chronic). Otherwise, neg for joint swelling.  Skin: Negative for rash and wound.  Neurological: Negative for dizziness, syncope, speech difficulty, weakness and numbness.    Allergies  Bactrim  Home Medications   Current Outpatient Rx  Name Route Sig Dispense Refill  . ALBUTEROL SULFATE HFA 108 (90 BASE) MCG/ACT IN AERS Inhalation Inhale 2 puffs into the lungs every 6 (six) hours as needed. For wheezing    . CITALOPRAM HYDROBROMIDE 20 MG PO TABS Oral Take 20  mg by mouth daily.    Marland Kitchen CLONAZEPAM 0.5 MG PO TABS Oral Take 0.5 mg by mouth daily.    Marland Kitchen NAPROXEN 500 MG PO TABS Oral Take 500 mg by mouth 2 (two) times daily as needed. For pain    . QUETIAPINE FUMARATE 400 MG PO TABS Oral Take 400 mg by mouth at bedtime.    . OXYCODONE-ACETAMINOPHEN 10-325 MG PO TABS Oral Take 1 tablet by mouth 2 (two) times daily as needed. For pain 10 tablet 0    BP 116/79  Pulse 72  Temp 98.4 F (36.9 C) (Oral)  Resp 20  Ht 5\' 6"  (1.676 m)  Wt 170 lb (77.111 kg)  BMI 27.44 kg/m2  SpO2 100%  LMP 02/12/2012  Physical Exam Consitutional: Pt in no acute distress.   Head: Normocephalic and atraumatic.    Eyes: Extraocular motion intact, no scleral icterus Neck: Supple without meningismus, mass, or overt JVD Respiratory: Effort normal and breath sounds normal. No respiratory distress. CV: Heart regular rate and regular rhythm (sinus), no obvious murmurs.  Pulses +2 and symmetric Abdomen: Soft, non-tender, non-distended. No rebound or guarding.  MSK: . Approximately 6 cm to the left upper spine in the mid thoracic region, the patient is tender to palpation over the location where she describes her tightness. There is no erythema or swelling.   The contralateral side is nontender to palpation.  Entire central spine nontender to hammer percussion.   Otherwise extremities are atraumatic without deformity, ROM intact Skin: Warm, dry, intact Neuro: Alert and oriented, no motor deficit noted.  Neg leg raise.  Neg clonus BLE.  Psychiatric: Mood and affect are normal    ED Course  Procedures (including critical care time)   Labs Reviewed  POCT I-STAT TROPONIN I   Dg Chest 2 View  03/05/2012  *RADIOLOGY REPORT*  Clinical Data: Chest pain.  Shortness of breath.  History of asthma.  CHEST - 2 VIEW  Comparison: None.  Findings: Cardiomediastinal silhouette unremarkable.  Lungs clear. Bronchovascular markings normal.  Pulmonary vascularity normal.  No pleural effusions.  No pneumothorax.  Visualized bony thorax intact.  IMPRESSION: Normal examination.  Original Report Authenticated By: Arnell Sieving, M.D.     1. Back strain       MDM  Unclear etiology.  No wheezing.   Her symptoms and their onset are somewhat suggestive of angina.  However, her tenderness to palpation is not consistent with angina, and her tenderness is directly over the site of her described tenderness.  Palpation here does reproduce her symptoms.   TIMI 0. She has had 3-1/2 hours of symptoms at this time, and is amenable to a single troponin rule out. EKG. Chest x-ray.   Certainly a pulmonary embolus  could cause tenderness  and her symptoms as well. She is at low risk for pulmonary embolus.  Her legs are uniform without edema. Her vital signs are normal.  She has a subjective feeling of hypoxia however there is no evidence of hypoxia on exam she is not tachycardic. Accordingly, she is amenable to [facility] criteria and she is negative.  Pain quality and symptomology not consistent with dissection.  Low back pain: This is chronic. There are no red flags for progression of disease. Negative leg raise. Negative clonus.  Normal chest x-ray. Patient feels better after by mouth pain medication. Overall clinical picture suggests musculoskeletal back pain similar to the patient's chronic back pain. Discharged home to follow up with primary care.`   Patient happy  with plan. PT DC home stable.  Discussed with pt the clinical impression, treatment in the ED, and follow up plan.  We alslo discussed the indications for returning to the ED, which include shortness or breath, confusion, fever, new weakness or numbness, chest pain, or any other concerning symptom.  The pt understood the treatment and plan, is stable, and is able to leave the ED.        Larrie Kass, MD 03/05/12 [phone]

## 2012-03-06 NOTE — ED Provider Notes (Signed)
Medical screening examination/treatment/procedure(s) were conducted as a shared visit with non-physician practitioner(s) and myself.  I personally evaluated the patient during the encounter   Nat Christen, MD 03/06/12 [phone]

## 2012-05-16 ENCOUNTER — Emergency Department (HOSPITAL_COMMUNITY)
Admission: EM | Admit: 2012-05-16 | Discharge: 2012-05-17 | Disposition: A | Discharge status: Other Acute Inpatient Hospital | Payer: Self-pay | Attending: Emergency Medicine | Admitting: Emergency Medicine

## 2012-05-16 ENCOUNTER — Encounter (HOSPITAL_COMMUNITY): Payer: Self-pay | Admitting: Emergency Medicine

## 2012-05-16 DIAGNOSIS — F17200 Nicotine dependence, unspecified, uncomplicated: Secondary | ICD-10-CM | POA: Insufficient documentation

## 2012-05-16 DIAGNOSIS — R45851 Suicidal ideations: Secondary | ICD-10-CM | POA: Insufficient documentation

## 2012-05-16 DIAGNOSIS — F1010 Alcohol abuse, uncomplicated: Secondary | ICD-10-CM | POA: Insufficient documentation

## 2012-05-16 DIAGNOSIS — F1910 Other psychoactive substance abuse, uncomplicated: Secondary | ICD-10-CM | POA: Insufficient documentation

## 2012-05-16 DIAGNOSIS — Z79899 Other long term (current) drug therapy: Secondary | ICD-10-CM | POA: Insufficient documentation

## 2012-05-16 DIAGNOSIS — F209 Schizophrenia, unspecified: Secondary | ICD-10-CM | POA: Insufficient documentation

## 2012-05-16 DIAGNOSIS — F341 Dysthymic disorder: Secondary | ICD-10-CM | POA: Insufficient documentation

## 2012-05-16 HISTORY — DX: Schizophrenia, unspecified: F20.9

## 2012-05-16 LAB — COMPREHENSIVE METABOLIC PANEL
ALT: 18 U/L (ref 0–35)
AST: 19 U/L (ref 0–37)
Alkaline Phosphatase: 70 U/L (ref 39–117)
CO2: 28 mEq/L (ref 19–32)
Chloride: 107 mEq/L (ref 96–112)
GFR calc non Af Amer: >90 mL/min (ref >90)
Potassium: 3.7 mEq/L (ref 3.5–5.1)
Sodium: 142 mEq/L (ref 135–145)
Total Bilirubin: 0.2 mg/dL — ABNORMAL LOW (ref 0.3–1.2)

## 2012-05-16 LAB — CBC
Platelets: 321 10*3/uL (ref 150–400)
RBC: 4.57 MIL/uL (ref 3.87–5.11)
WBC: 3.4 10*3/uL — ABNORMAL LOW (ref 4.0–10.5)

## 2012-05-16 LAB — RAPID URINE DRUG SCREEN, HOSP PERFORMED
Barbiturates: NOT DETECTED
Cocaine: POSITIVE — AB
Tetrahydrocannabinol: NOT DETECTED

## 2012-05-16 LAB — GLUCOSE, CAPILLARY: Glucose-Capillary: 84 mg/dL (ref 70–99)

## 2012-05-16 MED ORDER — ONDANSETRON HCL 8 MG PO TABS: 4.0000 mg | ORAL_TABLET | Freq: Three times a day (TID) | ORAL | Status: DC | PRN

## 2012-05-16 MED ORDER — LORAZEPAM 1 MG PO TABS: 1.0000 mg | ORAL_TABLET | Freq: Three times a day (TID) | ORAL | Status: DC | PRN

## 2012-05-16 MED ORDER — IBUPROFEN 200 MG PO TABS
600.0000 mg | ORAL_TABLET | Freq: Three times a day (TID) | ORAL | Status: DC | PRN
  Administered 2012-05-17: 600 mg via ORAL
  Filled 2012-05-16: qty 3

## 2012-05-16 MED ORDER — NICOTINE 21 MG/24HR TD PT24
21.0000 mg | MEDICATED_PATCH | Freq: Every day | TRANSDERMAL | Status: DC
  Administered 2012-05-16 – 2012-05-17 (×2): 21 mg via TRANSDERMAL
  Filled 2012-05-16 (×2): qty 1

## 2012-05-16 MED ORDER — ALUM & MAG HYDROXIDE-SIMETH 200-200-20 MG/5ML PO SUSP: 30.0000 mL | ORAL | Status: DC | PRN

## 2012-05-16 MED ORDER — ACETAMINOPHEN 325 MG PO TABS: 650.0000 mg | ORAL_TABLET | ORAL | Status: DC | PRN

## 2012-05-16 MED ORDER — BENZONATATE 100 MG PO CAPS
100.0000 mg | ORAL_CAPSULE | ORAL | Status: AC
  Administered 2012-05-16: 100 mg via ORAL
  Filled 2012-05-16: qty 1

## 2012-05-16 NOTE — ED Notes (Signed)
Pt sts generalized fatigue and depression x weeks; pt sts not had psych meds x 5 months; pt sts SI without a plan; pt admits to ETOH use and crack cocaine use 3 days ago; pt cooperative at present

## 2012-05-16 NOTE — ED Provider Notes (Signed)
History   Scribed for Toy Baker, MD, the patient was seen in room TR10C/TR10C . This chart was scribed by Lewanda Rife.    CSN: [id number]  Arrival date & time 05/16/12  1148   First MD Initiated Contact with Patient 05/16/12 1251      Chief Complaint  Patient presents with  . Medical Clearance  . Suicidal    (Consider location/radiation/quality/duration/timing/severity/associated sxs/prior Treatment)  The history is provided by the patient.   Samantha Nash is a 46 y.o. female who presents to the Emergency Department complaining of  depression and suicidal ideations for several months. Pt reports associated generalized fatigue. Pt admits to substance abuse consisting of crack, cocaine, and alcohol 3 ago. Pt denies using substances today. Pt denies having a plan to kill herself. Pt denies homicidal ideations. Pt states she hears voices and admits she is not taking her medications since being off medicaid. Pt states that if she leaves here she is unsure if she would hurt herself  Past Medical History  Diagnosis Date  . Anemia   . Depression   . Anxiety   . Asthma   . Cocaine abuse   . Bipolar 1 disorder   . Back problem   . Schizophrenia     Past Surgical History  Procedure Date  . Tubal ligation 1992    Family History  Problem Relation Age of Onset  . Diabetes Mother   . Kidney disease Mother     kidney failure  . Thrombocytopenia Mother   . Depression Father   . Alcohol abuse Father   . Mental illness Father   . Heart failure Son     History  Substance Use Topics  . Smoking status: Current Everyday Smoker -- 0.5 packs/day for 30 years    Types: Cigarettes  . Smokeless tobacco: Never Used  . Alcohol Use: Yes     socially    OB History    Grav Para Term Preterm Abortions TAB SAB Ect Mult Living   7 6 6  1  1   5       Review of Systems  Constitutional: Negative.   HENT: Negative.   Respiratory: Negative.   Cardiovascular: Negative.     Gastrointestinal: Negative.   Musculoskeletal: Negative.   Skin: Negative.   Neurological: Negative.   Hematological: Negative.   Psychiatric/Behavioral: Positive for suicidal ideas.  All other systems reviewed and are negative.    Allergies  Bactrim  Home Medications   Current Outpatient Rx  Name Route Sig Dispense Refill  . ALBUTEROL SULFATE HFA 108 (90 BASE) MCG/ACT IN AERS Inhalation Inhale 2 puffs into the lungs every 6 (six) hours as needed. For wheezing    . CITALOPRAM HYDROBROMIDE 20 MG PO TABS Oral Take 20 mg by mouth daily.    Marland Kitchen CLONAZEPAM 0.5 MG PO TABS Oral Take 0.5 mg by mouth daily.    Marland Kitchen NAPROXEN 500 MG PO TABS Oral Take 500 mg by mouth 2 (two) times daily as needed. For pain    . OXYCODONE-ACETAMINOPHEN 10-325 MG PO TABS Oral Take 1 tablet by mouth 2 (two) times daily as needed. For pain 10 tablet 0  . QUETIAPINE FUMARATE 400 MG PO TABS Oral Take 400 mg by mouth at bedtime.      BP 126/70  Pulse 91  Temp 97.5 F (36.4 C) (Oral)  Resp 20  SpO2 97%  Physical Exam  Nursing note and vitals reviewed. Constitutional: She appears well-developed and well-nourished.  HENT:  Head: Normocephalic and atraumatic.  Eyes: Conjunctivae and EOM are normal.  Neck: Neck supple. No tracheal deviation present. No thyromegaly present.  Cardiovascular: Normal rate and regular rhythm.   No murmur heard. Pulmonary/Chest: Effort normal and breath sounds normal.  Abdominal: Soft. Bowel sounds are normal. She exhibits no distension. There is no tenderness.  Musculoskeletal: Normal range of motion. She exhibits no edema and no tenderness.  Neurological: She is alert. Coordination normal.  Skin: Skin is warm and dry. No rash noted.  Psychiatric: Her affect is blunt. She is slowed and withdrawn. Thought content is delusional. She expresses suicidal ideation. She expresses no homicidal ideation. She expresses no suicidal plans.    ED Course  Procedures (including critical care  time)   Labs Reviewed  CBC  COMPREHENSIVE METABOLIC PANEL  ETHANOL  URINE RAPID DRUG SCREEN (HOSP PERFORMED)   No results found.   No diagnosis found.    MDM  Patient is medically cleared and is awaiting assessment by the behavior health team      I personally performed the services described in this documentation, which was scribed in my presence. The recorded information has been reviewed and considered.    Toy Baker, MD 05/21/12 [phone]

## 2012-05-16 NOTE — ED Notes (Signed)
Baxter Hire, ACT team member in to consult with patient.

## 2012-05-16 NOTE — ED Notes (Signed)
Patient c/o constant cough and request medication.
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
## 2012-05-16 NOTE — ED Notes (Signed)
Marcus from behavioral health is at bedside.

## 2012-05-16 NOTE — ED Notes (Signed)
Pt states she has been "hearing the evil person telling me to hurt myself and I see things but when I focus, I know they aren't there." Pt states she has been off medication as she no longer has medicaid. Pt states she abuses crack cocaine, etoh, and marijuana. Denies using any substance today.
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
## 2012-05-17 ENCOUNTER — Inpatient Hospital Stay (HOSPITAL_COMMUNITY)
Admission: EM | Admit: 2012-05-17 | Discharge: 2012-05-23 | DRG: 885 | Disposition: A | Discharge status: Home / Self Care | Payer: Federal, State, Local not specified - Other | Source: Ambulatory Visit | Attending: Psychiatry | Admitting: Psychiatry

## 2012-05-17 ENCOUNTER — Encounter (HOSPITAL_COMMUNITY): Payer: Self-pay | Admitting: *Deleted

## 2012-05-17 DIAGNOSIS — N938 Other specified abnormal uterine and vaginal bleeding: Secondary | ICD-10-CM | POA: Diagnosis present

## 2012-05-17 DIAGNOSIS — F209 Schizophrenia, unspecified: Secondary | ICD-10-CM

## 2012-05-17 DIAGNOSIS — F1994 Other psychoactive substance use, unspecified with psychoactive substance-induced mood disorder: Secondary | ICD-10-CM

## 2012-05-17 DIAGNOSIS — D649 Anemia, unspecified: Secondary | ICD-10-CM | POA: Diagnosis present

## 2012-05-17 DIAGNOSIS — N390 Urinary tract infection, site not specified: Secondary | ICD-10-CM | POA: Diagnosis present

## 2012-05-17 DIAGNOSIS — F1420 Cocaine dependence, uncomplicated: Secondary | ICD-10-CM | POA: Diagnosis present

## 2012-05-17 DIAGNOSIS — N925 Other specified irregular menstruation: Secondary | ICD-10-CM | POA: Diagnosis present

## 2012-05-17 DIAGNOSIS — F39 Unspecified mood [affective] disorder: Principal | ICD-10-CM | POA: Diagnosis present

## 2012-05-17 DIAGNOSIS — N949 Unspecified condition associated with female genital organs and menstrual cycle: Secondary | ICD-10-CM | POA: Diagnosis present

## 2012-05-17 DIAGNOSIS — F1010 Alcohol abuse, uncomplicated: Secondary | ICD-10-CM | POA: Diagnosis present

## 2012-05-17 DIAGNOSIS — T50904A Poisoning by unspecified drugs, medicaments and biological substances, undetermined, initial encounter: Secondary | ICD-10-CM

## 2012-05-17 DIAGNOSIS — F1414 Cocaine abuse with cocaine-induced mood disorder: Secondary | ICD-10-CM | POA: Diagnosis present

## 2012-05-17 DIAGNOSIS — A599 Trichomoniasis, unspecified: Secondary | ICD-10-CM | POA: Diagnosis present

## 2012-05-17 DIAGNOSIS — F17200 Nicotine dependence, unspecified, uncomplicated: Secondary | ICD-10-CM | POA: Diagnosis present

## 2012-05-17 DIAGNOSIS — N898 Other specified noninflammatory disorders of vagina: Secondary | ICD-10-CM

## 2012-05-17 LAB — URINE MICROSCOPIC-ADD ON

## 2012-05-17 LAB — URINALYSIS, ROUTINE W REFLEX MICROSCOPIC
Glucose, UA: NEGATIVE mg/dL
Hgb urine dipstick: NEGATIVE
Ketones, ur: NEGATIVE mg/dL
Protein, ur: NEGATIVE mg/dL

## 2012-05-17 MED ORDER — METRONIDAZOLE 500 MG PO TABS
2000.0000 mg | ORAL_TABLET | Freq: Once | ORAL | Status: AC
  Administered 2012-05-17: 2000 mg via ORAL
  Filled 2012-05-17: qty 4

## 2012-05-17 MED ORDER — CEPHALEXIN 250 MG PO CAPS
500.0000 mg | ORAL_CAPSULE | Freq: Three times a day (TID) | ORAL | Status: DC
  Administered 2012-05-17: 500 mg via ORAL
  Filled 2012-05-17: qty 2

## 2012-05-17 MED ORDER — BENZONATATE 100 MG PO CAPS
100.0000 mg | ORAL_CAPSULE | Freq: Three times a day (TID) | ORAL | Status: DC | PRN
  Administered 2012-05-17 (×2): 100 mg via ORAL
  Filled 2012-05-17 (×2): qty 1

## 2012-05-17 NOTE — ED Notes (Signed)
Pt requested med for cough. Notified EDP

## 2012-05-17 NOTE — ED Notes (Signed)
Pt states her urine smells strong and is requesting to have it checked. Verbal order received for ua
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
## 2012-05-17 NOTE — Progress Notes (Signed)
Patient ID: Samantha Nash, female   DOB: Mar 28, 1966, 46 y.o.   MRN: [id number] Pt admitted voluntarily to [facility].  Mbr stated she was hearing voices telling her to harm herself.  Mbr denies plan and contracts for safety.  Pt states she also uses cocaine, ETOH and marijuana.  Pt has been incarcerated several times in the past d/t "my lifestyle".  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  Pt states that she knew the person who approached her was a cop but needed two hots and a cot.  Pt laughed and stated she was let go 20 minutes after being taken in.  Pt states she had a prior attempt in 2010 but did not discuss how she tried to carry it out.  Pt stated that at that time she had just lost her son.  Pt states she is from [place] and is currently here homeless. Pt states her children are trying to figure a way to get her some help.  Fifteen minute checks in progress. Pt oriented to unit.  Pt safe on unit.

## 2012-05-17 NOTE — Tx Team (Signed)
Initial Interdisciplinary Treatment Plan  PATIENT STRENGTHS: (choose at least two) Ability for insight Average or above average intelligence Communication skills General fund of knowledge Motivation for treatment/growth  PATIENT STRESSORS: Financial difficulties Legal issue Substance abuse   PROBLEM LIST: Problem List/Patient Goals Date to be addressed Date deferred Reason deferred Estimated date of resolution  Suicidal ideation 05/17/2012     depression 05/17/2012     Medication compliance 05/17/2012     housing 05/17/2012                                    DISCHARGE CRITERIA:  Ability to meet basic life and health needs Adequate post-discharge living arrangements Improved stabilization in mood, thinking, and/or behavior Motivation to continue treatment in a less acute level of care Need for constant or close observation no longer present Reduction of life-threatening or endangering symptoms to within safe limits Safe-care adequate arrangements made Verbal commitment to aftercare and medication compliance  PRELIMINARY DISCHARGE PLAN: Attend aftercare/continuing care group Outpatient therapy Participate in family therapy Placement in alternative living arrangements  PATIENT/FAMIILY INVOLVEMENT: This treatment    plan has been presented to and reviewed with the patient, Samantha Nash.  The patient and family have been given the opportunity to ask questions and make suggestions.  Hoover Browns 05/17/2012, 10:47 PM

## 2012-05-18 DIAGNOSIS — F1010 Alcohol abuse, uncomplicated: Secondary | ICD-10-CM | POA: Diagnosis not present

## 2012-05-18 DIAGNOSIS — F4321 Adjustment disorder with depressed mood: Secondary | ICD-10-CM

## 2012-05-18 DIAGNOSIS — F1414 Cocaine abuse with cocaine-induced mood disorder: Secondary | ICD-10-CM | POA: Diagnosis present

## 2012-05-18 MED ORDER — NICOTINE 14 MG/24HR TD PT24
14.0000 mg | MEDICATED_PATCH | Freq: Every day | TRANSDERMAL | Status: DC
  Administered 2012-05-18 – 2012-05-19 (×2): 14 mg via TRANSDERMAL
  Filled 2012-05-18 (×5): qty 1

## 2012-05-18 MED ORDER — SODIUM CHLORIDE 0.9 % IN NEBU: 3.0000 mL | INHALATION_SOLUTION | Freq: Three times a day (TID) | RESPIRATORY_TRACT | Status: DC | PRN

## 2012-05-18 MED ORDER — NICOTINE 14 MG/24HR TD PT24
MEDICATED_PATCH | TRANSDERMAL | Status: AC
  Filled 2012-05-18: qty 1

## 2012-05-18 MED ORDER — GUAIFENESIN 100 MG/5ML PO SOLN
5.0000 mL | ORAL | Status: DC | PRN
  Administered 2012-05-18 – 2012-05-21 (×4): 100 mg via ORAL

## 2012-05-18 MED ORDER — NAPROXEN SODIUM 550 MG PO TABS
550.0000 mg | ORAL_TABLET | Freq: Two times a day (BID) | ORAL | Status: DC | PRN
  Filled 2012-05-18: qty 1

## 2012-05-18 MED ORDER — HYDROXYZINE HCL 50 MG PO TABS
50.0000 mg | ORAL_TABLET | Freq: Every day | ORAL | Status: DC
  Administered 2012-05-18 – 2012-05-19 (×2): 50 mg via ORAL
  Filled 2012-05-18 (×5): qty 1

## 2012-05-18 MED ORDER — NAPROXEN SODIUM 275 MG PO TABS: 440.0000 mg | ORAL_TABLET | Freq: Two times a day (BID) | ORAL | Status: DC | PRN

## 2012-05-18 MED ORDER — BENZONATATE 100 MG PO CAPS
100.0000 mg | ORAL_CAPSULE | Freq: Three times a day (TID) | ORAL | Status: DC | PRN
  Administered 2012-05-19 – 2012-05-20 (×2): 100 mg via ORAL
  Filled 2012-05-18 (×3): qty 1

## 2012-05-18 MED ORDER — MAGNESIUM HYDROXIDE 400 MG/5ML PO SUSP: 30.0000 mL | Freq: Every day | ORAL | Status: DC | PRN

## 2012-05-18 MED ORDER — METRONIDAZOLE 500 MG PO TABS
2000.0000 mg | ORAL_TABLET | Freq: Once | ORAL | Status: DC
  Filled 2012-05-18: qty 4

## 2012-05-18 MED ORDER — ACETAMINOPHEN 325 MG PO TABS: 650.0000 mg | ORAL_TABLET | Freq: Four times a day (QID) | ORAL | Status: DC | PRN

## 2012-05-18 MED ORDER — HYDROXYZINE HCL 50 MG PO TABS
50.0000 mg | ORAL_TABLET | Freq: Every evening | ORAL | Status: DC | PRN
  Administered 2012-05-19 – 2012-05-22 (×3): 50 mg via ORAL

## 2012-05-18 MED ORDER — ALUM & MAG HYDROXIDE-SIMETH 200-200-20 MG/5ML PO SUSP
30.0000 mL | ORAL | Status: DC | PRN
  Administered 2012-05-22: 30 mL via ORAL

## 2012-05-18 MED ORDER — CITALOPRAM HYDROBROMIDE 20 MG PO TABS
20.0000 mg | ORAL_TABLET | Freq: Every day | ORAL | Status: DC
  Administered 2012-05-18 – 2012-05-23 (×6): 20 mg via ORAL
  Filled 2012-05-18 (×6): qty 1
  Filled 2012-05-18: qty 14
  Filled 2012-05-18: qty 1

## 2012-05-18 MED ORDER — SULFAMETHOXAZOLE-TMP DS 800-160 MG PO TABS
1.0000 | ORAL_TABLET | Freq: Two times a day (BID) | ORAL | Status: DC
  Administered 2012-05-18: 1 via ORAL
  Filled 2012-05-18 (×5): qty 1

## 2012-05-18 MED ORDER — CIPROFLOXACIN HCL 500 MG PO TABS
500.0000 mg | ORAL_TABLET | Freq: Two times a day (BID) | ORAL | Status: AC
  Administered 2012-05-18 – 2012-05-22 (×10): 500 mg via ORAL
  Filled 2012-05-18 (×11): qty 1

## 2012-05-18 NOTE — Progress Notes (Signed)
[facility] Adult Inpatient Family/Significant Other Suicide Prevention Education  Suicide Prevention Education:  Patient Refusal for Family/Significant Other Suicide Prevention Education: The patient Samantha Nash has refused to provide written consent for family/significant other to be provided Family/Significant Other Suicide Prevention Education during admission and/or prior to discharge.  Physician notified.  Pt. accepted information on suicide prevention, warning signs to look for with suicide and crisis line numbers to use. The pt. agreed to call crisis line numbers if having warning signs or having thoughts of suicide.   Pt is homeless and would like to go to a shelter. Pt.'s daughters are coming at the end of the month to take her back to [place]. Pt does not want daughters called at this time.  [facility] 05/18/2012, 4:46 PM

## 2012-05-18 NOTE — H&P (Signed)
Psychiatric Admission Assessment Adult  Patient Identification:  Samantha Nash Date of Evaluation:  05/18/2012 45yo SAAF CC:  SI for several months presents because of AH -not command in nature. History of Present Illness: Since loosing Medicaid back in January has been abusing drugs and alcohol. No measurable ETOH  UDS+cocaine and she was treated for trich in ED with 2gm of Flagyl. Is waiting for SSDI determination. Hoped to get Medicaid reinstated by getting admitted. Has been depressed since her son's death in 02/07/09 at age 71 from a heart anomaly.   Past Psychiatric History: This is her first psych admission used to have therapy and med management through [facility].   Substance Abuse History: Has used cocaine for a long long time same for alcohol usually has 2 malt liquor drinks.  Social History:    reports that she has been smoking Cigarettes.  She has a 15 pack-year smoking history. She has never used smokeless tobacco. She reports that she drinks alcohol. She reports that she uses illicit drugs (Marijuana and Cocaine) about once per week. Has a GED went to 1oth grade. %5of 6 children are living. Ages range 29-224 daughters one son. Family Psych History:  Past Medical History:     Past Medical History  Diagnosis Date  . Anemia   . Depression   . Anxiety   . Asthma   . Cocaine abuse   . Bipolar 1 disorder   . Back problem   . Schizophrenia        Past Surgical History  Procedure Date  . Tubal ligation 1992    Allergies:  Allergies  Allergen Reactions  . Bactrim Hives and Itching  . Lactose Intolerance (Gi)     Current Medications:  Prior to Admission medications   Medication Sig Start Date End Date Taking? Authorizing Provider  naproxen sodium (ANAPROX) 220 MG tablet Take 440 mg by mouth 2 (two) times daily as needed. For pain    Historical Provider, MD    Mental Status Examination/Evaluation: Objective:  Appearance: Disheveled  Psychomotor  Activity:  Decreased  Eye Contact::  Good  Speech:  Clear and Coherent and Normal Rate  Volume:  Normal  Mood: depressed   Affect:  Congruent  Thought Process:  Clear rational goal oriented- get Medicaid   Orientation:  Full  Thought Content:  Hears voices when using   Suicidal Thoughts:  No  Homicidal Thoughts:  No  Judgement:  Impaired  Insight:  Fair    DIAGNOSIS:    AXIS I Bereavement, Substance Abuse and Substance Induced Mood Disorder  AXIS II Deferred  AXIS III See medical history.  AXIS IV economic problems, educational problems, housing problems, occupational problems, other psychosocial or environmental problems and problems with primary support group  AXIS V 51-60 moderate symptoms     Treatment Plan Summary: Admit for safety and stabilization. No detox  for cocaine and no evidence or need  to detox from alcohol. Will treat UTI with Cipro. Already received treatment for Trich in ED. Needs to work with counselor regarding discharge follow up. Celexa started to address her depression about son's death.

## 2012-05-18 NOTE — H&P (Signed)
  Pt was seen by me today and I agree with the key elements documented in H&P.  

## 2012-05-18 NOTE — Progress Notes (Signed)
D.  Pt in bed on approach, not feeling well.  Did not wish to get up for evening AA group.  Pt has complained of a cough earlier today, not noted currently.  Denies SI/HI/hallucinations at present.  A.  Support and encouragement offered R.  Resting in bed at present, no acute distress noted.  Will continue to monitor.

## 2012-05-18 NOTE — Progress Notes (Signed)
Patient ID: Samantha Nash, female   DOB: 11/28/1965, 46 y.o.   MRN: [id number]   [facility] Group Notes:  (Counselor/Nursing/MHT/Case Management/Adjunct)  05/18/2012 1:15 PM  Type of Therapy:  Group Therapy, Dance/Movement Therapy   Participation Level:  Did Not Attend     [name] 05/18/2012. 2:32 PM
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
## 2012-05-18 NOTE — Progress Notes (Signed)
Psychoeducational Group Note  Date:  05/18/2012 Time:  1515  Group Topic/Focus:  Healthy Communication:   The focus of this group is to discuss communication, barriers to communication, as well as healthy ways to communicate with others.  Participation Level:  Active  Participation Quality:  Appropriate, Attentive, Sharing and Supportive  Affect:  Appropriate and Flat  Cognitive:  Alert and Appropriate  Insight:  Good  Engagement in Group:  Good  Additional Comments:  Pt attended and participated in Health Communications group where pts completed the "Love Languages Quiz" and then discussed what these love languages mean in reference to their communication style. Pts love language ended up being "Quality Time" which pt stated "this couldn't be more right". Pt stated "I like when people listen and hear what I have to say and do not interrupt me. This means a lot to me."  Dalia Heading 05/18/2012, 6:40 PM

## 2012-05-18 NOTE — Progress Notes (Signed)
D-Patient has been out in milieu interacting with peers and attending afternoon groups.  A- Affect is pleasant and cooperative.  C/O cough but refused prn tessalon. Offered saline gargle. Refused patient inventory sheet. Denies SI/HI. R- Support and encouragement given. Continue current POC and evaluation of treatment goals. Continue 15' checks for safety.

## 2012-05-18 NOTE — Progress Notes (Signed)
Patient ID: Samantha Nash, female   DOB: 12-04-1965, 46 y.o.   MRN: [id number]  Pt. did not attend aftercare planning group.

## 2012-05-18 NOTE — Progress Notes (Signed)
Patient showing no s/s of withdrawal. Not sharing significant history of SA issues or role that is playing in current life situation.

## 2012-05-18 NOTE — Progress Notes (Signed)
Patient did not attend this evenings speaker AA meeting.  

## 2012-05-19 DIAGNOSIS — F17200 Nicotine dependence, unspecified, uncomplicated: Secondary | ICD-10-CM

## 2012-05-19 DIAGNOSIS — F1420 Cocaine dependence, uncomplicated: Secondary | ICD-10-CM

## 2012-05-19 MED ORDER — NAPROXEN 500 MG PO TABS
500.0000 mg | ORAL_TABLET | Freq: Two times a day (BID) | ORAL | Status: DC | PRN
  Administered 2012-05-20: 500 mg via ORAL
  Filled 2012-05-19: qty 1

## 2012-05-19 NOTE — Progress Notes (Signed)
D.  Pt pleasant and bright on approach.  Pt requested medication for a persistent cough that she has had.  Pt denies SI/HI/hallucinations at this time.  Attended half of AA group but then returned to bed.  Pt states that she is not an alcoholic so this is why she didn't really get into that group.  Interacting appropriately on unit.  A.  MHT explained that addiction operates in the same manner whether to a drug or alcohol and Pt stated that she does understand this. Support and encouragement offered.  R.  Will continue to monitor.

## 2012-05-19 NOTE — Progress Notes (Signed)
Psychoeducational Group Note  Date:  05/19/2012 Time:  1515  Group Topic/Focus:  Conflict Resolution:   The focus of this group is to discuss the conflict resolution process and how it may be used upon discharge.  Participation Level:  Did Not Attend  Samantha Nash 05/19/2012, 6:58 PM

## 2012-05-19 NOTE — Progress Notes (Signed)
Patient ID: Samantha Nash, female   DOB: Nov 28, 1965, 46 y.o.   MRN: [id number]   [facility] Group Notes:  (Counselor/Nursing/MHT/Case Management/Adjunct)  05/19/2012 1:15 PM  Type of Therapy:  Group Therapy, Dance/Movement Therapy   Participation Level:  Minimal  Participation Quality:  Appropriate  Affect:  Appropriate  Cognitive:  Appropriate  Insight:  Limited  Engagement in Group:  Limited  Engagement in Therapy:  Limited  Modes of Intervention:  Clarification, Problem-solving, Role-play, Socialization and Support  Summary of Progress/Problems: Therapist and group members discussed the idea of control and how to exert our power to change our circumstances. Group members discussed problems they are currently dealing with and problems they will face at discharge and how they will use their power of control to combat these issues. Pt was active during the beginning of group and participated in interaction with therapist.   [name]   05/19/2012. 3:00 PM

## 2012-05-19 NOTE — Progress Notes (Signed)
Patient ID: Samantha Nash, female   DOB: [birth year], 46 y.o.   MRN: [id number]  Pt did not attend aftercare planning group but did receive a workbook.

## 2012-05-19 NOTE — Progress Notes (Signed)
Psychoeducational Group Note  Date:  05/19/2012 Time:  1015  Group Topic/Focus:  Crisis Planning:   The purpose of this group is to help patients create a crisis plan for use upon discharge or in the future, as needed.  Participation Level:  Did Not Attend  Participation Quality:  Drowsy  Affect:  Blunted  Cognitive:  Appropriate  Insight:  None  Engagement in Group:  None  Additional Comments:    Cresenciano Lick 05/19/2012, 2:34 PM

## 2012-05-19 NOTE — Progress Notes (Signed)
D-Patient has been isolative to room with no group attendance.  A-States Seorquel "makes me sleepy". Denies SI.  Refuses patient inventory.  Removed nicotine patch due to "dizziness".  No prn medications requested. R- support and encouragement given.  Continue current POC and evaluation of treatment goals.  Patient non-specific about d/c plan. 15' checks for safety.

## 2012-05-19 NOTE — Progress Notes (Signed)
Samantha Nash - [place] MD Progress Note  05/19/2012 10:51 AM  Diagnosis:   Axis I: Alcohol Abuse, Substance Abuse and Substance Induced Mood Disorder Axis II: Deferred Axis III:  Past Medical History  Diagnosis Date  . Anemia   . Depression   . Anxiety   . Asthma   . Cocaine abuse   . Bipolar 1 disorder   . Back problem   . Schizophrenia    Subjective: Samantha Nash is lying in bed today, extremely sedated, and needed multiple verbal prompts to keep her awake enough to respond to questions. She denies that she is having any cravings for cocaine or other substances. She states that she is "extremely depressed." She endorses some thoughts earlier today regarding self-harm, but would not expand on that. She denies any homicidal ideation. She does endorse some auditory and visual hallucinations, but was unable to give details regarding these.  ADL's:  Impaired  Sleep: Good  Appetite:  Fair  Suicidal Ideation:  Patient endorses some suicidal thoughts earlier today, but does not expand. Homicidal Ideation:  Patient denies thought, plan, or intent  AEB (as evidenced by):  Mental Status Examination/Evaluation: Objective:  Appearance: Disheveled  Eye Contact::  None  Speech:  Garbled, Slow and Slurred  Volume:  Decreased  Mood:  Depressed  Affect:  Congruent  Thought Process:  Logical  Orientation:  Other:  Difficult to assess  Thought Content:  Hallucinations: Auditory Visual  Suicidal Thoughts:  Yes.  without intent/plan  Homicidal Thoughts:  No  Memory:  Immediate;   Poor  Judgement:  Impaired  Insight:  Lacking  Psychomotor Activity:  Decreased  Concentration:  Poor  Recall:  Poor  Akathisia:  No  Handed:    AIMS (if indicated):     Assets:    Sleep:  Number of Hours: 5    Vital Signs:Blood pressure 120/74, pulse 101, temperature 98.4 F (36.9 C), temperature source Oral, resp. rate 18, height 5\' 5"  (1.651 m), weight 58.514 kg (129 lb), last menstrual period 05/10/2012. Current  Medications: Current Facility-Administered Medications  Medication Dose Route Frequency Provider Last Rate Last Dose  . acetaminophen (TYLENOL) tablet 650 mg  650 mg Oral Q6H PRN Mickeal Skinner, MD      . alum & mag hydroxide-simeth (MAALOX/MYLANTA) 200-200-20 MG/5ML suspension 30 mL  30 mL Oral Q4H PRN Mickeal Skinner, MD      . benzonatate (TESSALON) capsule 100 mg  100 mg Oral TID PRN Mickie D. Adams, PA      . ciprofloxacin (CIPRO) tablet 500 mg  500 mg Oral BID Mickie D. Adams, PA   500 mg at 05/19/12 0804  . citalopram (CELEXA) tablet 20 mg  20 mg Oral Daily Mickie D. Adams, PA   20 mg at 05/19/12 0804  . guaiFENesin (ROBITUSSIN) 100 MG/5ML solution 100 mg  5 mL Oral Q4H PRN Wonda Cerise, MD   100 mg at 05/18/12 2209  . hydrOXYzine (ATARAX/VISTARIL) tablet 50 mg  50 mg Oral QHS Mickie D. Adams, PA   50 mg at 05/18/12 2209   And  . hydrOXYzine (ATARAX/VISTARIL) tablet 50 mg  50 mg Oral QHS PRN Mickie D. Adams, PA      . magnesium hydroxide (MILK OF MAGNESIA) suspension 30 mL  30 mL Oral Daily PRN Mickeal Skinner, MD      . naproxen (NAPROSYN) tablet 500 mg  500 mg Oral BID PRN Curlene Labrum Readling, MD      . nicotine (NICODERM CQ - dosed in mg/24 hours) patch 14  mg  14 mg Transdermal Daily Mickeal Skinner, MD   14 mg at 05/18/12 0713  . sodium chloride 0.9 % nebulizer solution 3 mL  3 mL Nebulization Q8H PRN Mickie D. Adams, PA      . DISCONTD: metroNIDAZOLE (FLAGYL) tablet 2,000 mg  2,000 mg Oral Once Mickie D. Adams, PA      . DISCONTD: naproxen sodium (ANAPROX) tablet 412.5 mg  412.5 mg Oral BID PRN Mickie D. Adams, PA      . DISCONTD: naproxen sodium (ANAPROX) tablet 550 mg  550 mg Oral BID PRN Ronny Bacon, MD      . DISCONTD: nicotine (NICODERM CQ - dosed in mg/24 hours) 14 mg/24hr patch           . DISCONTD: sulfamethoxazole-trimethoprim (BACTRIM DS) 800-160 MG per tablet 1 tablet  1 tablet Oral Q12H Ronny Bacon, MD   1 tablet at 05/18/12 1115    Lab Results:  Results for orders  placed during the hospital encounter of 05/16/12 (from the past 48 hour(s))  URINALYSIS, ROUTINE W REFLEX MICROSCOPIC     Status: Abnormal   Collection Time   05/17/12  1:24 PM      Component Value Range Comment   Color, Urine YELLOW  YELLOW    APPearance CLEAR  CLEAR    Specific Gravity, Urine 1.012  1.005 - 1.030    pH 6.5  5.0 - 8.0    Glucose, UA NEGATIVE  NEGATIVE mg/dL    Hgb urine dipstick NEGATIVE  NEGATIVE    Bilirubin Urine NEGATIVE  NEGATIVE    Ketones, ur NEGATIVE  NEGATIVE mg/dL    Protein, ur NEGATIVE  NEGATIVE mg/dL    Urobilinogen, UA 0.2  0.0 - 1.0 mg/dL    Nitrite NEGATIVE  NEGATIVE    Leukocytes, UA MODERATE (*) NEGATIVE   URINE MICROSCOPIC-ADD ON     Status: Abnormal   Collection Time   05/17/12  1:24 PM      Component Value Range Comment   Squamous Epithelial / LPF RARE  RARE    WBC, UA 11-20  <3 WBC/hpf    RBC / HPF 0-2  <3 RBC/hpf    Bacteria, UA FEW (*) RARE    Urine-Other TRICHOMONAS PRESENT       Physical Findings: AIMS: Facial and Oral Movements Muscles of Facial Expression: None, normal Lips and Perioral Area: None, normal Jaw: None, normal Tongue: None, normal,Extremity Movements Upper (arms, wrists, hands, fingers): None, normal Lower (legs, knees, ankles, toes): None, normal, Trunk Movements Neck, shoulders, hips: None, normal, Overall Severity Severity of abnormal movements (highest score from questions above): None, normal Incapacitation due to abnormal movements: None, normal Patient's awareness of abnormal movements (rate only patient's report): No Awareness, Dental Status Current problems with teeth and/or dentures?: No Does patient usually wear dentures?: No  CIWA:  CIWA-Ar Total: 0  COWS:     Treatment Plan Summary: Daily contact with patient to assess and evaluate symptoms and progress in treatment Medication management  Plan: We will continue her current plan of care, and reassess when the patient is more  lucid.  Jaylynn Siefert 05/19/2012, 10:51 AM

## 2012-05-20 DIAGNOSIS — F1010 Alcohol abuse, uncomplicated: Secondary | ICD-10-CM

## 2012-05-20 DIAGNOSIS — A599 Trichomoniasis, unspecified: Secondary | ICD-10-CM | POA: Diagnosis present

## 2012-05-20 DIAGNOSIS — F1910 Other psychoactive substance abuse, uncomplicated: Secondary | ICD-10-CM

## 2012-05-20 DIAGNOSIS — D649 Anemia, unspecified: Secondary | ICD-10-CM | POA: Diagnosis present

## 2012-05-20 DIAGNOSIS — F1994 Other psychoactive substance use, unspecified with psychoactive substance-induced mood disorder: Secondary | ICD-10-CM

## 2012-05-20 LAB — RETICULOCYTES: Retic Ct Pct: 1 % (ref 0.4–3.1)

## 2012-05-20 LAB — URINALYSIS, ROUTINE W REFLEX MICROSCOPIC
Glucose, UA: NEGATIVE mg/dL
Nitrite: NEGATIVE
Protein, ur: NEGATIVE mg/dL

## 2012-05-20 LAB — URINE MICROSCOPIC-ADD ON

## 2012-05-20 MED ORDER — HYDROXYZINE HCL 50 MG PO TABS
50.0000 mg | ORAL_TABLET | Freq: Two times a day (BID) | ORAL | Status: DC
  Administered 2012-05-20 – 2012-05-21 (×2): 50 mg via ORAL
  Filled 2012-05-20 (×8): qty 1

## 2012-05-20 NOTE — Progress Notes (Signed)
[facility] Group Notes:  (Counselor/Nursing/MHT/Case Management/Adjunct)  05/20/2012   Type of Therapy:  Group Therapy at 1:15 to 2:30 PM  Participation Level:  Did Not Attend  Samantha Nash 05/20/2012, 3:59 PM

## 2012-05-20 NOTE — Progress Notes (Signed)
Patient ID: Samantha Nash, female   DOB: [birth year], 46 y.o.   MRN: [id number] Pt c/o dizziness and cough. Requested that cbg be checked. Result  Was 97. Also c/o anxiety in process of getting prn order. She has attended part of one group and has been in bed except for meals.

## 2012-05-20 NOTE — Discharge Planning (Signed)
Samantha Nash came to AM group briefly, then left.  Later told me she gets anxious around others and that is the reason for her departure.  Has been to a 2 year rehab program in [place], not interested in one here.  Came to [facility] with mother to be closer to her sibling.  Is estranged from both of them.  Interested in getting into the shelter and continuing in her quest for disability.  Called Cathleen Fears who will check on her eligibility for bed, and come see her tomorrow.

## 2012-05-20 NOTE — Progress Notes (Signed)
  D.. Resting in bed with eyes open most of the evening.  Minimal interaction.    Denies SI/HI.   A.  Encouraged pt. To go to group. R.  Pt. Reported "going back to bed" " not feeling well."

## 2012-05-20 NOTE — Progress Notes (Signed)
[facility] MD Progress Note                                         05/20/2012    Samantha Nash January 05, 1966    [id number] Hospital day #3  Axis I: Alcohol Abuse, Substance Abuse and Substance Induced Mood Disorder  Axis II: Deferred  Axis III:  Past Medical History   Diagnosis  Date   .  Anemia    .   Asthma   .  Back pain   .   Trichomonas   .      .      .      .       The patient was seen today and reports the following:  Sleep: " Im not sleeping well because Im depressed and hearing voices". Appetite: Appetite describe as fair  Mild (1-10) Severe  Depression (1-10): 9 Anxiety (1-10): 10 Hopelessness (1-10): 8  Suicidal Ideation: The patient denies suicidal ideation, however states that voices tell her to "hurt myself", "act a fool or dumb", "Im not worthy". Patient contracts for safety. Plan: None Intent: None Means: None  Homicidal Ideation: The patient denies homicidal ideation. Plan: None Intent: None Means: None  Eye Contact:  Less than optimal General Appearance: lying in bed under covers Behavior:  Cooperative  Motor Behavior:  No abnormal movements. No ticks or tremors. Speech: Slow, soft speech  Mental Status:  Orientation x 3. Level of Consciousness:   alert Mood: anxious, depressed Affect: congruent   Thought Process: logical, goal directed Thought Content: Hallucinations- auditory- "Im not worthy", "to hurt self", "to do stupid stuff". Perception: Memory- Immediate fair; Problem-solving-limited  Judgment: Impaired Insight: Lacking Cognition: Memory- immediate- poor; Concentration- limited  VS: height is 5\' 5"  (1.651 m) and weight is 58.514 kg (129 lb). Her oral temperature is 98.3 F (36.8 C). Her blood pressure is 107/78 and her pulse is 114. Her respiration is 16.  Current Medication:   . ciprofloxacin  500 mg Oral BID  . citalopram  20 mg Oral Daily  . hydrOXYzine  50 mg Oral BID BM & HS  . nicotine  14 mg Transdermal Daily  .  DISCONTD: hydrOXYzine  50 mg Oral QHS    Lab results:  Results for orders placed during the hospital encounter of 05/17/12 (from the past 48 hour(s))  GLUCOSE, CAPILLARY     Status: Normal   Collection Time   05/20/12  2:48 PM      Component Value Range Comment   Glucose-Capillary 97  70 - 99 mg/dL      No results found for this or any previous visit for  Last 48 hours.  Group attendance: Poor group participation.   ROS:    Constitutional: WDWN Adult in NAD   GI: Negative for Nausea, vomiting, diarrhea or constipation.    Neuro: Negative for dizziness, blurred vision, visual changes, headaches   Resp: Negative for wheezing, SOB. Positive for moist cough.   Cardio: Negative for CP, diaphoresis. Pt c/o generalized fatigue   MSK: Negative for joint pain, swelling, DROM, or ambulatory difficulties.  Time was spent with the patient discussing the current symptoms and encouraging group participation.  Treatment Plan: 1. Increased current vistaril 50 mg Q HS to vistaril 50 mg tid to assist with current anxiety level. 2. Continue currently prescribed Celexa 20 mg daily. 3. Order Anemia panel  to evaluate patient's current Anemia status. 4. Will order follow up urinalysis to evaluate TOC for trichomonas treated with 2 gm flagyl 05/17/12 in ER. 5. Encourage group participation. 6. Continue to monitor patient's functioning/status 7. Discharge planning.            Norval Gable, FNP-BC 05/20/2012

## 2012-05-20 NOTE — Progress Notes (Signed)
Patient did attend the first half of the  evening speaker AA meeting. Pt left group after thirty minutes and went to lay in bed.

## 2012-05-20 NOTE — Progress Notes (Signed)
Psychoeducational Group Note  Date:  05/20/2012 Time:  1100  Group Topic/Focus:  Self Care:   The focus of this group is to help patients understand the importance of self-care in order to improve or restore emotional, physical, spiritual, interpersonal, and financial health.  Participation Level: Did Not Attend  Participation Quality:  Not Applicable  Affect:  Not Applicable  Cognitive:  Not Applicable  Insight:  Not Applicable  Engagement in Group: Not Applicable  Additional Comments:  Pt did not attend group but remained lying in bed.   Sharyn Lull 05/20/2012, 1:10 PM

## 2012-05-21 LAB — IRON AND TIBC
Iron: 58 ug/dL (ref 42–135)
TIBC: 451 ug/dL (ref 250–470)

## 2012-05-21 LAB — FERRITIN: Ferritin: 6 ng/mL — ABNORMAL LOW (ref 10–291)

## 2012-05-21 LAB — VITAMIN B12: Vitamin B-12: 483 pg/mL (ref 211–911)

## 2012-05-21 MED ORDER — FERROUS FUMARATE 325 (106 FE) MG PO TABS
1.0000 | ORAL_TABLET | Freq: Every morning | ORAL | Status: DC
  Administered 2012-05-22 – 2012-05-23 (×2): 106 mg via ORAL
  Filled 2012-05-21: qty 14
  Filled 2012-05-21 (×3): qty 1

## 2012-05-21 MED ORDER — QUETIAPINE FUMARATE 50 MG PO TABS
50.0000 mg | ORAL_TABLET | Freq: Every day | ORAL | Status: DC
  Administered 2012-05-21: 50 mg via ORAL
  Filled 2012-05-21 (×3): qty 1

## 2012-05-21 NOTE — Progress Notes (Signed)
Patient resting quietly with eyes closed. Respirations even and unlabored. No distress noted. Q 15 minute check continues to maintain safety   

## 2012-05-21 NOTE — Progress Notes (Signed)
Psychoeducational Group Note  Date:  05/21/2012 Time:  1100  Group Topic/Focus:  Recovery Goals:   The focus of this group is to identify appropriate goals for recovery and establish a plan to achieve them.  Participation Level: Did Not Attend  Participation Quality:  Not Applicable  Affect:  Not Applicable  Cognitive:  Not Applicable  Insight:  Not Applicable  Engagement in Group: Not Applicable  Additional Comments:  Pt remained lying in bed during group and attempted to attend group at 11:40 but was asked to just attend next group.   Sharyn Lull 05/21/2012, 1:31 PM

## 2012-05-21 NOTE — Discharge Planning (Signed)
Samantha Nash reluctantly attended group.  C/O no sleep.  Insistent that she needs Seroquel 400 for sleep.  Let her know there is a bed for her at [facility], and that Cathleen Fears will come to visit her today.

## 2012-05-21 NOTE — Progress Notes (Signed)
[facility] MD Progress Note  05/21/2012 5:36 PM Hospital day #4   Diagnosis:  Axis I: Alcohol Abuse, Substance Abuse and Substance Induced Mood Disorder  Axis II: Deferred  Axis III:  Past Medical History   Diagnosis  Date   .  Anemia    .  Asthma    .  Back pain    .  Trichomonas      S:  Pt states she is still hearing voices which are commanding her to exhibit negative behaviors to others. She admonishes continuing to isolate in response.  Pt not sleeping but intermittently. After pt refocused, Pt states "sometimes I go to [place].Marland Kitchenit just happens.  Its my schizophrenia."  O: Pt disengaged at one point during psychiatric assessment and did not respond verbally to provider for approx 1-2 minutes. During this time, pt was looking around as she saw something on the floor.   Sleep: " Im not sleeping well because Im depressed and hearing voices".    ADL's:  Intact  Sleep: Poor  Appetite:  Fair  Suicidal Ideation:  Plan:  denies plan Intent:  denies plan Means:  denies plan Homicidal Ideation:  Plan:  denies plan Intent:  denies plan Means:  denies plan  AEB (as evidenced by):  Mental Status Examination/Evaluation: Objective:  Appearance: Fairly Groomed  Patent attorney::  Fair  Speech:  Clear and Coherent  Volume:  Normal  Mood:  Anxious, Depressed and Hopeless  Affect:  Blunt  Thought Process:  Goal Directed  Orientation:  Full  Thought Content:  Hallucinations: Auditory  Suicidal Thoughts:  No  Homicidal Thoughts:  No  Memory:  Immediate;   Fair  Judgement:  Impaired  Insight:  Lacking  Psychomotor Activity:  Restlessness  Concentration:  Fair  Recall:  Fair  Akathisia:  No  Handed:  Right  AIMS (if indicated):     Assets:  Physical Health  Sleep:  Number of Hours: 4.5    Vital Signs:Blood pressure 100/71, pulse 103, temperature 98.2 F (36.8 C), temperature source Oral, resp. rate 14, height 5\' 5"  (1.651 m), weight 58.514 kg (129 lb), last menstrual  period 05/10/2012. Current Medications: Current Facility-Administered Medications  Medication Dose Route Frequency Provider Last Rate Last Dose  . acetaminophen (TYLENOL) tablet 650 mg  650 mg Oral Q6H PRN Mickeal Skinner, MD      . alum & mag hydroxide-simeth (MAALOX/MYLANTA) 200-200-20 MG/5ML suspension 30 mL  30 mL Oral Q4H PRN Mickeal Skinner, MD      . benzonatate (TESSALON) capsule 100 mg  100 mg Oral TID PRN Mickie D. Adams, PA   100 mg at 05/20/12 1924  . ciprofloxacin (CIPRO) tablet 500 mg  500 mg Oral BID Mickie D. Adams, PA   500 mg at 05/21/12 1610  . citalopram (CELEXA) tablet 20 mg  20 mg Oral Daily Mickie D. Adams, PA   20 mg at 05/21/12 9604  . guaiFENesin (ROBITUSSIN) 100 MG/5ML solution 100 mg  5 mL Oral Q4H PRN Wonda Cerise, MD   100 mg at 05/21/12 1714  . hydrOXYzine (ATARAX/VISTARIL) tablet 50 mg  50 mg Oral QHS PRN Norval Gable, NP   50 mg at 05/20/12 2131  . hydrOXYzine (ATARAX/VISTARIL) tablet 50 mg  50 mg Oral BID BM & HS Norval Gable, NP   50 mg at 05/21/12 1424  . magnesium hydroxide (MILK OF MAGNESIA) suspension 30 mL  30 mL Oral Daily PRN Mickeal Skinner, MD      . naproxen (NAPROSYN)  tablet 500 mg  500 mg Oral BID PRN Curlene Labrum Readling, MD   500 mg at 05/20/12 1919  . nicotine (NICODERM CQ - dosed in mg/24 hours) patch 14 mg  14 mg Transdermal Daily Mickeal Skinner, MD   14 mg at 05/19/12 1300  . sodium chloride 0.9 % nebulizer solution 3 mL  3 mL Nebulization Q8H PRN Mickie D. Pernell Dupre, [place]        Lab Results:  Results for orders placed during the hospital encounter of 05/17/12 (from the past 48 hour(s))  GLUCOSE, CAPILLARY     Status: Normal   Collection Time   05/20/12  2:48 PM      Component Value Range Comment   Glucose-Capillary 97  70 - 99 mg/dL   URINALYSIS, ROUTINE W REFLEX MICROSCOPIC     Status: Abnormal   Collection Time   05/20/12  7:27 PM      Component Value Range Comment   Color, Urine YELLOW  YELLOW    APPearance CLEAR  CLEAR    Specific Gravity,  Urine 1.011  1.005 - 1.030    pH 6.0  5.0 - 8.0    Glucose, UA NEGATIVE  NEGATIVE mg/dL    Hgb urine dipstick NEGATIVE  NEGATIVE    Bilirubin Urine NEGATIVE  NEGATIVE    Ketones, ur NEGATIVE  NEGATIVE mg/dL    Protein, ur NEGATIVE  NEGATIVE mg/dL    Urobilinogen, UA 0.2  0.0 - 1.0 mg/dL    Nitrite NEGATIVE  NEGATIVE    Leukocytes, UA TRACE (*) NEGATIVE   URINE MICROSCOPIC-ADD ON     Status: Abnormal   Collection Time   05/20/12  7:27 PM      Component Value Range Comment   Squamous Epithelial / LPF FEW (*) RARE    WBC, UA 0-2  <3 WBC/hpf   VITAMIN B12     Status: Normal   Collection Time   05/20/12  8:02 PM      Component Value Range Comment   Vitamin B-12 483  211 - 911 pg/mL   FOLATE     Status: Normal   Collection Time   05/20/12  8:02 PM      Component Value Range Comment   Folate 7.6     IRON AND TIBC     Status: Abnormal   Collection Time   05/20/12  8:02 PM      Component Value Range Comment   Iron 58  42 - 135 ug/dL    TIBC 161  096 - 045 ug/dL    Saturation Ratios 13 (*) 20 - 55 %    UIBC 393  125 - 400 ug/dL   FERRITIN     Status: Abnormal   Collection Time   05/20/12  8:02 PM      Component Value Range Comment   Ferritin 6 (*) 10 - 291 ng/mL   RETICULOCYTES     Status: Normal   Collection Time   05/20/12  8:02 PM      Component Value Range Comment   Retic Ct Pct 1.0  0.4 - 3.1 %    RBC. 4.90  3.87 - 5.11 MIL/uL    Retic Count, Manual 49.0  19.0 - 186.0 K/uL     Physical Findings: AIMS: Facial and Oral Movements Muscles of Facial Expression: None, normal Lips and Perioral Area: None, normal Jaw: None, normal Tongue: None, normal, Extremity Movements Upper (arms, wrists, hands, fingers): None, normal Lower (legs, knees, ankles, toes): None,  normal,  Trunk Movements- none Neck, shoulders, hips: None, normal, Overall Severity Severity of abnormal movements (highest score from questions above): None, normal Incapacitation due to abnormal movements: None,  normal Patient's awareness of abnormal movements (rate only patient's report): No Awareness, Dental Status Current problems with teeth and/or dentures?:no Does patient usually wear dentures?: No  CIWA:  CIWA-Ar Total: 0  COWS:     Treatment Plan Summary:   Will began patient on a daily Fe tablet- 106 mg. Will discontinue patient's bid vistaril Will prescribe 50 mg seroquel Q HS for sleep and auditory hallucinations. Will continue to encourage daily group participation. Pt planning discharge to [facility] upon discharge with plan to move with daughter in [place] to [place]. Daily contact with patient to assess and evaluate symptoms and progress in treatment     Norval Gable- FNP-BC 05/21/2012, 5:36 PM

## 2012-05-21 NOTE — Progress Notes (Signed)
D: Pt in bed a lot. Pt did not attend the PM Group.Pt on ABX--encouraged to drink plenty of fluids.Pt somewhat irritable. Reports that she hears voices. HS medication has been changed. A: Supported & encouraged. Continues on 15 minute checks. R: Pt safety maintained.

## 2012-05-21 NOTE — Progress Notes (Signed)
[facility] Group Notes:  (Counselor/Nursing/MHT/Case Management/Adjunct)  05/21/2012 5:27 PM  Type of Therapy:  Psychoeducational Skills  Participation Level:  Did Not Attend  Summary of Progress/Problems: Samantha Nash did not attend psycho education group that focused in using quality time with support systems/individuals to engage in healthy coping skills and stregthen their healthy support system.  with supports.    Wandra Scot 05/21/2012, 5:27 PM

## 2012-05-21 NOTE — Progress Notes (Signed)
[facility] Group Notes:  (Counselor/Nursing/MHT/Case Management/Adjunct)  05/21/2012 3:47 PM  Type of Therapy:  Group Therapy 1:15 to 2:30 PM  Participation Level:  None  Participation Quality:  Inattentive  Affect:  Irritable  Cognitive:  Oriented  Insight:  None shared  Engagement in Group:  None noted  Engagement in Therapy:  None  Modes of Intervention:  Education and Support  Summary of Progress/Problems: Patient attended small portion of group presentation by Interior and spatial designer of  [employer] [facility] [employer] ([employer]).    Clide Dales 05/21/2012, 3:49 PM

## 2012-05-21 NOTE — Progress Notes (Signed)
D:  Patient stayed in bed much of the day.  She did not attend groups.  She rates her depression and hopelessness both at 6.  She has had vague complaints of headache and not feeling well.  States she wants to discharge.  Continues to ask about what is being done about her bedtime medications.  A:  Medications given as ordered.  Encouraged patient to get up and attend groups.  R:  Cooperative, but not interacting in programming.

## 2012-05-21 NOTE — Progress Notes (Signed)
05/21/2012         Time: 1500      Group Topic/Focus: The focus of the group is on enhancing the patients' ability to utilize positive relaxation strategies by practicing several that can be used at discharge.  Participation Level: Did not attend  Participation Quality: Not Applicable  Affect: Not Applicable  Cognitive: Not Applicable   Additional Comments: Patient refused group.   Bennie Scaff 05/21/2012 3:46 PM  

## 2012-05-21 NOTE — Treatment Plan (Signed)
Interdisciplinary Treatment Plan Update (Adult)  Date: 05/21/2012  Time Reviewed: 8:09 AM   Progress in Treatment: Attending groups: Yes Participating in groups: Yes Taking medication as prescribed: Yes Tolerating medication: Yes   Family/Significant other contact made:  No Patient understands diagnosis:  Yes  As evidenced by asking for help with mood stabilization and finding housing Discussing patient identified problems/goals with staff:  Yes  See below Medical problems stabilized or resolved:  Yes Denies suicidal/homicidal ideation: Yes  In tx team Issues/concerns per patient self-inventory:  Yes  Poor sleep, Depression and hopelessness are 7's Other:  New problem(s) identified: N/A  Reason for Continuation of Hospitalization: Depression Medication stabilization  Interventions implemented related to continuation of hospitalization: Adjust medication for sleep, mood stabilization.  Encourage group attendance and participation  Additional comments:  Estimated length of stay: 1-2 days  Discharge Plan: Stay at [facility] house,  Follow up outpt  New goal(s): N/A  Review of initial/current patient goals per problem list:   1.  Goal(s):Eliminate SI  Met:  Yes  Target date:9/10  As evidenced ZO:XWRU report on self inventory  2.  Goal (s):Stabilize mood  Met:  No  Target date:9/11  As evidenced EA:VWUJWJ will report her depression at a 5 or less  3.  Goal(s):Assess for psychosis  Met:  No  Target date:9/11  As evidenced XB:JYNWG anti psychotic to address symptoms if indicated  4.  Goal(s):Identify comprehensive sobriety plan  Met:  No  Target date: 9/11  As evidenced NF:AOZH report  Attendees: Patient:  Samantha Nash 05/21/2012 8:09 AM  Family:     Physician:  Lupe Carney 05/21/2012 8:09 AM   Nursing:    05/21/2012 8:09 AM   Case Manager:  Richelle Ito, LCSW 05/21/2012 8:09 AM   Counselor:   05/21/2012 8:09 AM   Other:     Other:     Other:       Other:      Scribe for Treatment Team:   Ida Rogue, 05/21/2012 8:09 AM

## 2012-05-22 MED ORDER — QUETIAPINE FUMARATE 100 MG PO TABS
100.0000 mg | ORAL_TABLET | Freq: Every day | ORAL | Status: DC
  Administered 2012-05-22: 100 mg via ORAL
  Filled 2012-05-22: qty 14
  Filled 2012-05-22 (×2): qty 1

## 2012-05-22 NOTE — Progress Notes (Signed)
[facility] In Patient Progress Note 05/22/2012 11:55 AM Samantha Nash [birth year] [id number] Hospital day #:5 Diagnosis:  Axis I: Alcohol/Substance Abuse/Dependencies   ADL's:  Intact Sleep:  Minimally worse Appetite:ok  Groups:Limited  Subjective: Samantha Nash reports that she only slept 2 hours yesterday, but was called on this in group, when another patient noted that she had been in bed all day.  She reports that she is not hearing any voices so far today and this is new for her. She asks that her seroquel be increased as it helps her to sleep.   height is 5\' 5"  (1.651 m) and weight is 58.514 kg (129 lb). Her oral temperature is 99.3 F (37.4 C). Her blood pressure is 107/67 and her pulse is 112. Her respiration is 14.   Objective: Speech is clear, patient up and active in the unit milieu. She is fully aware that group participation is required at [facility], but does not seem motivated to participate. This provider encouraged her to do so. Sx of withdrawal:None  Ros: ROS: Constitional: WDWN Adult in NAD COR: negative for SOB, CP, cough, wheezing GI: Negative for Nausea, vomiting, diarrhea, constipation, abdominal pain Neuro: negative for dizziness, blurred vision, headaches, numbness or tingling Ortho: negative for limb pain, swelling, change in ambulatory status.  Mental Status Exam Level of Consciousness: awake and alert Orientation: x  3 General Appearance : disheveled hair, scrubs Behavior:   cooperative Eye Contact:  Good Motor Behavior:  Normal Speech:  Normal Mood:  Euthymic  Suicidal Ideation: No suicidal ideation, no plan, no intent, no means. Homicidal Ideation:  No homicidal ideation, no plan, no intent, no means.  Affect:  Appropriate Anxiety Level:  Minimal Thought Process:  Coherent Thought Content:  WNL Perception:  Normal Judgment:  Fair Insight:  Absent Cognition:  At least average Sleep:  Number of Hours: 6.75  Lab Results:  Results for orders placed during the  hospital encounter of 05/17/12 (from the past 48 hour(s))  GLUCOSE, CAPILLARY     Status: Normal   Collection Time   05/20/12  2:48 PM      Component Value Range Comment   Glucose-Capillary 97  70 - 99 mg/dL   URINALYSIS, ROUTINE W REFLEX MICROSCOPIC     Status: Abnormal   Collection Time   05/20/12  7:27 PM      Component Value Range Comment   Color, Urine YELLOW  YELLOW    APPearance CLEAR  CLEAR    Specific Gravity, Urine 1.011  1.005 - 1.030    pH 6.0  5.0 - 8.0    Glucose, UA NEGATIVE  NEGATIVE mg/dL    Hgb urine dipstick NEGATIVE  NEGATIVE    Bilirubin Urine NEGATIVE  NEGATIVE    Ketones, ur NEGATIVE  NEGATIVE mg/dL    Protein, ur NEGATIVE  NEGATIVE mg/dL    Urobilinogen, UA 0.2  0.0 - 1.0 mg/dL    Nitrite NEGATIVE  NEGATIVE    Leukocytes, UA TRACE (*) NEGATIVE   URINE MICROSCOPIC-ADD ON     Status: Abnormal   Collection Time   05/20/12  7:27 PM      Component Value Range Comment   Squamous Epithelial / LPF FEW (*) RARE    WBC, UA 0-2  <3 WBC/hpf   VITAMIN B12     Status: Normal   Collection Time   05/20/12  8:02 PM      Component Value Range Comment   Vitamin B-12 483  211 - 911 pg/mL   FOLATE  Status: Normal   Collection Time   05/20/12  8:02 PM      Component Value Range Comment   Folate 7.6     IRON AND TIBC     Status: Abnormal   Collection Time   05/20/12  8:02 PM      Component Value Range Comment   Iron 58  42 - 135 ug/dL    TIBC 409  811 - 914 ug/dL    Saturation Ratios 13 (*) 20 - 55 %    UIBC 393  125 - 400 ug/dL   FERRITIN     Status: Abnormal   Collection Time   05/20/12  8:02 PM      Component Value Range Comment   Ferritin 6 (*) 10 - 291 ng/mL   RETICULOCYTES     Status: Normal   Collection Time   05/20/12  8:02 PM      Component Value Range Comment   Retic Ct Pct 1.0  0.4 - 3.1 %    RBC. 4.90  3.87 - 5.11 MIL/uL    Retic Count, Manual 49.0  19.0 - 186.0 K/uL    Labs are reviewed. 05/22/2012 12:03 PM Physical Findings: AIMS: CIWA:  CIWA-Ar Total: 0   COWS:     Medication:  . ciprofloxacin  500 mg Oral BID  . citalopram  20 mg Oral Daily  . ferrous fumarate  1 tablet Oral q morning - 10a  . QUEtiapine  50 mg Oral QHS  . DISCONTD: hydrOXYzine  50 mg Oral BID BM & HS  . DISCONTD: nicotine  14 mg Transdermal Daily  Treatment Plan Summary: 1. Continue the admission for crisis management and stabilization. 2. Medication management to reduce current symptoms to base line and improve the patient's overall level of functioning 3. Treat health problems as indicated. 4. Develop treatment plan to decrease risk of relapse upon discharge and the need for readmission. 5. Psycho-social education regarding relapse prevention and self care. 6. Health care follow up as needed for medical problems. 7. Restart home medications where appropriate.   Plan: 1. Will continue the Cipro for uti until course is complete. 2. Will continue the Citalopram 20mg  as written for depression. 3. Will continue the ferrous fumarate for low ferritin. 4. Will increase seroquel to 100mg  at hs for psychosis and sleep. 5. Will continue to monitor. 6. Anticipated d/c date would be tomorrow at the earliest, more likely Friday or Saturday. Samantha Nash. Samantha Nash PAC 05/22/2012, 11:55 AM

## 2012-05-22 NOTE — Progress Notes (Signed)
Discussed with team, read and reviewed.  Seroquel to be increased as necessary to therapeutic dose.

## 2012-05-22 NOTE — Discharge Planning (Signed)
Pt stated that she did not sleep well and requested more Seroquel. Faizah said that she is okay with staying an additional day to have meds increased. Possible d/c Thurs or Frid if stablized on meds. Pt has a bed at the [facility]. CM to call Cathleen Fears, shelter liaison,  to ask to hold a bed for 9/12.

## 2012-05-22 NOTE — [facility] Counselor (Signed)
[facility] Group Notes:  (Counselor/Nursing/MHT/Case Management/Adjunct)  05/22/2012   Type of Therapy:  Group Therapy at 1:15 to 2:30 PM  Participation Level:  Did Not Attend  Clide Dales 05/22/2012, 4:47 PM

## 2012-05-22 NOTE — Progress Notes (Signed)
Read and reviewed. Agree with A&P. 

## 2012-05-22 NOTE — Progress Notes (Signed)
Patient ID: Samantha Nash, female   DOB: [birth year], 46 y.o.   MRN: [id number] She has been up and to most of the groups today . She said that she would go to one miss the next one thru the day. She has been interacting with peers and staff more today has been smiling more. She denies SI thoughts depression  At 5 and hopelessness at 2  Denies withdrawal symptoms.

## 2012-05-22 NOTE — Progress Notes (Addendum)
Patient ID: Samantha Nash, female   DOB: [birth year], 46 y.o.   MRN: [id number] D: Pt. In bed, but alert. Pt. Reports "just waiting on my medicine." Pt. Says "taking one day at time". Pt. Reports upon release plans to go to a shelter and her daughters coming to get her in October to go back to [place].  Pt. Says she has 4 daughters. A: Staff will monitor q54min for safety. Pt. Encouraged to go to group. Writer provided emotional support, encouraged pt. To receive support of daughters as she continue recovery. R: Pt. Agrees she will be surrounded by supportive family. Pt. Is safe on the unit. Pt. Did not go to group.

## 2012-05-23 DIAGNOSIS — F39 Unspecified mood [affective] disorder: Principal | ICD-10-CM

## 2012-05-23 MED ORDER — CITALOPRAM HYDROBROMIDE 20 MG PO TABS: 20.0000 mg | ORAL_TABLET | Freq: Every day | ORAL | Status: AC

## 2012-05-23 MED ORDER — NAPROXEN SODIUM 220 MG PO TABS: 440.0000 mg | ORAL_TABLET | Freq: Two times a day (BID) | ORAL | Status: AC | PRN

## 2012-05-23 MED ORDER — FERROUS FUMARATE 325 (106 FE) MG PO TABS: 1.0000 | ORAL_TABLET | Freq: Every morning | ORAL | Status: AC

## 2012-05-23 MED ORDER — QUETIAPINE FUMARATE 100 MG PO TABS: 100.0000 mg | ORAL_TABLET | Freq: Every day | ORAL | Status: AC
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
## 2012-05-23 NOTE — Treatment Plan (Signed)
Interdisciplinary Treatment Plan Update (Adult)  Date: 05/23/2012  Time Reviewed:2:11 PM  Progress in Treatment:  Attending groups: Yes  Participating in groups: Yes  Taking medication as prescribed: Yes  Tolerating medication: Yes  Family/Significant other contact made: Yes Patient understands diagnosis: Yes  Discussing patient identified problems/goals with staff: Yes See below  Medical problems stabilized or resolved: Yes  Denies suicidal/homicidal ideation: Yes In tx team  Issues/concerns per patient self-inventory: none Other:  New problem(s) identified: N/A  Reason for Continuation of Hospitalization:  Discharge today Interventions implemented related to continuation of hospitalization: Additional comments:  Estimated length of stay: d/c today  Discharge Plan: see below New goal(s): N/A  Review of initial/current patient goals per problem list:  1. Goal(s):Eliminate SI  Met: Yes  Target date: 9/12 As evidenced ZO:XWRU report in tx team 2. Goal (s): Stabilize mood  Met: Yes Target date:9/12 As evidenced by: rating depression and hopelessness as a 0 on self-inventory 3. Goal(s): Assess for psychosis Met: Yes Target date: 9/12 As evidenced by:see Dr's note; started on medication 4. Identify comprehensive sobriety plan      Met: Yes      Target date: 9/12            As evidenced by:  Pt will stay with boyfriend's family until bed opens at the            [facility] and will follow-up with Steward Drone at [facility] daily. Walk-in for [facility]            counseling and medication.    Attendees:    Patient: Samantha Nash 05/23/2012 2:11 PM    Family:     Physician: Lupe Carney  05/23/2012 2:11 PM    Nursing: Roswell Miners 9/12/20132:11 PM   Case Manager: Richelle Ito, LCSW  05/23/2012 2:11 PM   Counselor: Ronda Fairly, LCSWA  9/12/20132:11 PM  .   Other: Herbert Seta Smart 05/23/2012 2:11PM   Other:     Other:     Other:     Scribe for Treatment Team: Trula Slade, MSW  Intern, 9/12/20132:11 PM

## 2012-05-23 NOTE — Progress Notes (Signed)
[facility] Inpatient Family/Significant Other Suicide Prevention Education  Suicide Prevention Education:  Contact Attempts: Otelia Limes at [phone] has been identified by the patient as the family member/significant other with whom the patient may be residing, and identified as the person(s) who will aid the patient in the event of a mental health crisis.  With written consent from the patient, two attempts were made to provide suicide prevention education, prior to and/or following the patient's discharge as consent was provided today.   We were unsuccessful in providing suicide prevention education.  A suicide education pamphlet was given to the patient to share with family/significant other. Weekend counseling staff had already provided suicide prevention education and this Clinical research associate spoke with patient about importance of  medication management and followup here after discharge and in [place] once she mores there with daughters.   Date and time of first attempt: 05/23/2012 1:15 PM  Date and time of second attempt: 05/23/2012 4:20 PM   Clide Dales 05/23/2012, 4:22 PM

## 2012-05-23 NOTE — Discharge Summary (Signed)
Patient:  Samantha Nash is an 46 y.o., female MRN:  [id number] DOB:  26-Nov-1965 Patient phone:  [phone] (home)  Patient address:   [address]  [place] [address]   Date of Admission:  05/17/2012 Date of Discharge: 05/23/2012  Discharge Diagnoses: Stimulant Use Disorder; Bereavement; Mood Disorder Unspecified; Microcytic Anemia; UTI, resolved; Trich Tx in ED  Level of Care:  OP  Hospital Course:   Samantha Nash was admitted for detox from alcohol, cocaine, and crisis management.  She was treated with the standard Librium protocol.  Medical problems were identified and treated.  Home medication was restarted as appropriate.     Improvement was monitored by CIWA scores and patient's daily report of withdrawal symptom reduction. Emotional and mental status was monitored by daily self inventory reports completed by the patient and clinical staff.      The patient was evaluated by the treatment team for stability and plans for continued recovery upon discharge. She was offered further treatment options upon discharge including Residential, IOP, and Outpatient treatment.  The patient's motivation was an integral factor for scheduling further treatment.  Employment, transportation, bed availability, health status, family support, and any pending legal issues were also considered.    Upon completion of detox the patient was both mentally and medically stable for discharge.     Samantha Nash will stay with her boyfriend's brother until the first of October when she will go to live with her daughter in [place]. Follow-up Information    Follow up with [facility].   Contact information:   [address]  [place]  [336] [phone]    Upon discharge, patient adamantly denies suicidal, homicidal ideations, auditory, visual hallucinations and or delusional thinking. They left [facility] with all personal belongings via personal transportation in no apparent distress.  Consults:  None  Significant Diagnostic  Studies:  None  Discharge Vitals:   Blood pressure 118/76, pulse 98, temperature 99.3 F (37.4 C), temperature source Oral, resp. rate 18, height 5\' 5"  (1.651 m), weight 58.514 kg (129 lb), last menstrual period 05/10/2012..  Mental Status Exam: See Mental Status Examination and Suicide Risk Assessment completed by Attending Physician prior to discharge.  Discharge destination:  Home  Is patient on multiple antipsychotic therapies at discharge:  No  Has Patient had three or more failed trials of antipsychotic monotherapy by history: N/A Recommended Plan for Multiple Antipsychotic Therapies: N/A Discharge Orders    Future Orders Please Complete By Expires   Diet - low sodium heart healthy      Increase activity slowly      Discharge instructions      Comments:   Take all of your medications as prescribed.  Be sure to keep ALL follow up appointments as scheduled. This is to ensure getting your refills on time to avoid any interruption in your medication.  If you find that you can not keep your appointment, call the clinic and reschedule. Be sure to tell the nurse if you will need a refill before your appointment.       Medication List     As of 05/23/2012 12:05 PM    TAKE these medications      Indication    citalopram 20 MG tablet   Commonly known as: CELEXA   Take 1 tablet (20 mg total) by mouth daily. For anxiety and depression.    Indication: Depression      ferrous fumarate 325 (106 FE) MG Tabs   Commonly known as: HEMOCYTE -  106 mg FE   Take 1 tablet (106 mg of iron total) by mouth every morning. For iron deficiency anemia.       naproxen sodium 220 MG tablet   Commonly known as: ANAPROX   Take 2 tablets (440 mg total) by mouth 2 (two) times daily as needed. For pain    Indication: Mild to Moderate Pain      QUEtiapine 100 MG tablet   Commonly known as: SEROQUEL   Take 1 tablet (100 mg total) by mouth at bedtime. For psychosis.    Indication: Schizophrenia            Follow-up Information    Follow up with [facility].   Contact information:   [address]  [place]  [336] [phone]        Follow-up recommendations:   Activities: Resume typical activities Diet: Resume typical diet Tests: none Other: Follow up with outpatient provider and report any side effects to out patient prescriber.  Comments:  Take all your medications as prescribed by your mental healthcare provider. Report any adverse effects and or reactions from your medicines to your outpatient provider promptly. Patient is instructed and cautioned to not engage in alcohol and or illegal drug use while on prescription medicines. In the event of worsening symptoms, patient is instructed to call the crisis hotline, 911 and or go to the nearest ED for appropriate evaluation and treatment of symptoms.  SignedTamala Julian [facility] 05/23/2012 12:05 PM

## 2012-05-23 NOTE — Progress Notes (Signed)
[facility] Case Management Discharge Plan:  Will you be returning to the same living situation after discharge: Yes,  will stay with boyfriend's brother until Oct 1st--moving with daughter to [place] At discharge, do you have transportation home?:Yes,  boyfriend Do you have the ability to pay for your medications:Yes,  [facility]  Release of information consent forms completed and in the chart;  Patient's signature needed at discharge.  Patient to Follow up at:  Follow-up Information    Follow up with [facility]. (Walk in Monday thru Friday 8am-9am)    Contact information:   [address]  [place]  [336] [phone]         Patient denies SI/HI:   Yes,  yes    Safety Planning and Suicide Prevention discussed:  Yes,  yes  Barrier to discharge identified:No.  Summary and Recommendations:   Samantha Nash, Samantha Nash 05/23/2012, 12:28 PM

## 2012-05-23 NOTE — Progress Notes (Signed)
Patient ID: Samantha Nash, female   DOB: 1966-04-29, 46 y.o.   MRN: [id number] Patient discharge instructions reviewed with patient, and patient verbalized understanding. Prescriptions and 7 day supply of medicine provided. Release form signed by patient. All belongings returned to patient. Denies SI.

## 2012-05-23 NOTE — Progress Notes (Signed)
Patient ID: Samantha Nash, female   DOB: 11-12-1965, 46 y.o.   MRN: [id number] She has been up and to one group. Interacting with peers and staff. Stated that she wanted to leave today and will be leaving today. Self inventory: depression and hopeless at 0,  Denies SI thoughts, no w/d symptoms.

## 2012-05-24 NOTE — Progress Notes (Signed)
Patient Discharge Instructions:  After Visit Summary (AVS):   Faxed to:  05/24/2012 Psychiatric Admission Assessment Note:   Faxed to:  05/24/2012 Suicide Risk Assessment - Discharge Assessment:   Faxed to:  05/24/2012 Faxed/Sent to the Next Level Care provider:  05/24/2012  Faxed to [facility] @ [phone]  Heloise Purpura Eduard Clos, 05/24/2012, 4:42 PM

## 2012-06-28 NOTE — Discharge Summary (Signed)
Read and reviewed. 

## 2014-07-13 ENCOUNTER — Encounter (HOSPITAL_COMMUNITY): Payer: Self-pay | Admitting: *Deleted

## 2020-03-05 ENCOUNTER — Emergency Department (HOSPITAL_COMMUNITY)
Admission: EM | Admit: 2020-03-05 | Discharge: 2020-03-05 | Disposition: A | Discharge status: Home / Self Care | Payer: Medicaid Other | Attending: Emergency Medicine | Admitting: Emergency Medicine

## 2020-03-05 ENCOUNTER — Encounter (HOSPITAL_COMMUNITY): Payer: Self-pay | Admitting: Emergency Medicine

## 2020-03-05 DIAGNOSIS — J45909 Unspecified asthma, uncomplicated: Secondary | ICD-10-CM | POA: Diagnosis not present

## 2020-03-05 DIAGNOSIS — N939 Abnormal uterine and vaginal bleeding, unspecified: Secondary | ICD-10-CM | POA: Diagnosis not present

## 2020-03-05 DIAGNOSIS — R109 Unspecified abdominal pain: Secondary | ICD-10-CM | POA: Insufficient documentation

## 2020-03-05 DIAGNOSIS — F17210 Nicotine dependence, cigarettes, uncomplicated: Secondary | ICD-10-CM | POA: Diagnosis not present

## 2020-03-05 DIAGNOSIS — R42 Dizziness and giddiness: Secondary | ICD-10-CM | POA: Insufficient documentation

## 2020-03-05 LAB — CBC
HCT: 35.9 % — ABNORMAL LOW (ref 36.0–46.0)
Hemoglobin: 11.1 g/dL — ABNORMAL LOW (ref 12.0–15.0)
MCH: 23.1 pg — ABNORMAL LOW (ref 26.0–34.0)
MCHC: 30.9 g/dL (ref 30.0–36.0)
MCV: 74.8 fL — ABNORMAL LOW (ref 80.0–100.0)
Platelets: 381 10*3/uL (ref 150–400)
RBC: 4.8 MIL/uL (ref 3.87–5.11)
RDW: 21.7 % — ABNORMAL HIGH (ref 11.5–15.5)
WBC: 5.4 10*3/uL (ref 4.0–10.5)
nRBC: 0 % (ref 0.0–0.2)

## 2020-03-05 LAB — I-STAT BETA HCG BLOOD, ED (MC, WL, AP ONLY): I-stat hCG, quantitative: <5 m[IU]/mL (ref <5)

## 2020-03-05 LAB — COMPREHENSIVE METABOLIC PANEL
ALT: 11 U/L (ref 0–44)
AST: 12 U/L — ABNORMAL LOW (ref 15–41)
Albumin: 3.4 g/dL — ABNORMAL LOW (ref 3.5–5.0)
Alkaline Phosphatase: 69 U/L (ref 38–126)
Anion gap: 10 (ref 5–15)
BUN: 8 mg/dL (ref 6–20)
CO2: 24 mmol/L (ref 22–32)
Calcium: 8.8 mg/dL — ABNORMAL LOW (ref 8.9–10.3)
Chloride: 105 mmol/L (ref 98–111)
Creatinine, Ser: 0.64 mg/dL (ref 0.44–1.00)
GFR calc Af Amer: >60 mL/min (ref >60)
GFR calc non Af Amer: >60 mL/min (ref >60)
Glucose, Bld: 100 mg/dL — ABNORMAL HIGH (ref 70–99)
Potassium: 3.5 mmol/L (ref 3.5–5.1)
Sodium: 139 mmol/L (ref 135–145)
Total Bilirubin: 0.4 mg/dL (ref 0.3–1.2)
Total Protein: 7.1 g/dL (ref 6.5–8.1)

## 2020-03-05 LAB — LIPASE, BLOOD: Lipase: 48 U/L (ref 11–51)

## 2020-03-05 MED ORDER — SODIUM CHLORIDE 0.9 % IV BOLUS
1000.0000 mL | Freq: Once | INTRAVENOUS | Status: AC
  Administered 2020-03-05: 1000 mL via INTRAVENOUS

## 2020-03-05 MED ORDER — DICYCLOMINE HCL 10 MG PO CAPS
20.0000 mg | ORAL_CAPSULE | Freq: Once | ORAL | Status: AC
Start: 2020-03-05 — End: 2020-03-05
  Administered 2020-03-05: 20 mg via ORAL
  Filled 2020-03-05: qty 2

## 2020-03-05 MED ORDER — MORPHINE SULFATE (PF) 4 MG/ML IV SOLN
4.0000 mg | Freq: Once | INTRAVENOUS | Status: AC
  Administered 2020-03-05: 4 mg via INTRAVENOUS
  Filled 2020-03-05: qty 1

## 2020-03-05 MED ORDER — MEGESTROL ACETATE 40 MG PO TABS
80.0000 mg | ORAL_TABLET | Freq: Every day | ORAL | Status: DC
  Administered 2020-03-05: 80 mg via ORAL
  Filled 2020-03-05: qty 2

## 2020-03-05 MED ORDER — MEGESTROL ACETATE 40 MG PO TABS: 80.0000 mg | ORAL_TABLET | Freq: Every day | ORAL | 0 refills | Status: AC

## 2020-03-05 NOTE — ED Notes (Signed)
This NT called into [facility] where pt showed this NT saturated menstrual pad, pt stated she changed the pad before she came to ED. RN made aware.

## 2020-03-05 NOTE — ED Notes (Signed)
All appropriate discharge materials reviewed at length with patient. Time for questions provided. Pt has no other questions at this time and verbalizes understanding of all provided materials.  

## 2020-03-05 NOTE — ED Provider Notes (Signed)
[facility] EMERGENCY DEPARTMENT Provider Note   CSN: [id number] Arrival date & time: 03/05/20  1245     History Chief Complaint  Patient presents with  . Vaginal Bleeding    Samantha Nash is a 54 y.o. female.  HPI  Patient is a 54 year old female with a history of anemia, anxiety, bipolar, depression  Patient states that she does not have an OB/GYN currently she has seen one of the past approximately 18 years ago and had a ultrasound which he states was normal.  On my review of EMR it appears the patient had a possible polyp and a definite fibroid.  Patient states that she has had vaginal bleeding for the past 3 days.  She states that it significantly worsened today and she went through 7 pads a morning before she came to the ER in 3 pad symptoms.  She states her last period was 1.5 weeks ago which she also thought was concerning.  She states she has a history of 1 miscarriage however states that she does not believe she is pregnant today.  She states that she was felt somewhat lightheaded today but has had no episodes of syncope or near syncope.  She denies any chest pain or shortness of breath.  She states that she has some crampy abdominal pain which is consistent with prior periods that she is had.  She denies any nausea, vomiting, diarrhea or constipation.  Patient states that she does not normally have heavy menstrual cycles.      Past Medical History:  Diagnosis Date  . Anemia   . Anxiety   . Asthma   . Back problem   . Bipolar 1 disorder ([place])   . Cocaine abuse ([place])   . Depression   . Schizophrenia [facility])     Patient Active Problem List   Diagnosis Date Noted  . Anemia 05/20/2012  . Trichomonas 05/20/2012  . ETOH abuse 05/18/2012  . Cocaine abuse with cocaine-induced mood disorder ([place]) 05/18/2012  . Schizophrenia ([place])   . Dysfunctional uterine bleeding 10/12/2011  . Smoker 10/12/2011  . Vaginal irritation 10/11/2011    Past Surgical  History:  Procedure Laterality Date  . TUBAL LIGATION  1992     OB History    Gravida  7   Para  6   Term  6   Preterm      AB  1   Living  5     SAB  1   TAB      Ectopic      Multiple      Live Births              Family History  Problem Relation Age of Onset  . Diabetes Mother   . Kidney disease Mother        kidney failure  . Thrombocytopenia Mother   . Depression Father   . Alcohol abuse Father   . Mental illness Father   . Heart failure Son     Social History   Tobacco Use  . Smoking status: Current Every Day Smoker    Packs/day: 0.50    Years: 30.00    Pack years: 15.00    Types: Cigarettes  . Smokeless tobacco: Never Used  Substance Use Topics  . Alcohol use: Yes    Comment: socially  . Drug use: Yes    Frequency: 1.0 times per week    Types: Marijuana, Cocaine    Comment: marijuana  Home Medications Prior to Admission medications   Medication Sig Start Date End Date Taking? Authorizing Provider  ferrous fumarate (HEMOCYTE - 106 MG FE) 325 (106 FE) MG TABS Take 1 tablet (106 mg of iron total) by mouth every morning. For iron deficiency anemia. Patient not taking: Reported on 03/05/2020 05/23/12   Ruben Im, PA-C  megestrol (MEGACE) 40 MG tablet Take 2 tablets (80 mg total) by mouth daily for 14 days. 03/05/20 03/19/20  Tedd Sias, PA  naproxen sodium (ANAPROX) 220 MG tablet Take 2 tablets (440 mg total) by mouth 2 (two) times daily as needed. For pain Patient not taking: Reported on 03/05/2020 05/23/12   Nena Polio T, PA-C    Allergies    Bactrim, Celebrex [celecoxib], and Lactose intolerance (gi)  Review of Systems   Review of Systems  Constitutional: Negative for fever.  HENT: Negative for congestion.   Respiratory: Negative for shortness of breath.   Cardiovascular: Negative for chest pain.  Gastrointestinal: Negative for abdominal distention.  Genitourinary: Positive for vaginal bleeding.  Neurological:  Positive for light-headedness. Negative for dizziness and headaches.    Physical Exam Updated Vital Signs BP (!) 168/98   Pulse 63   Temp 98 F (36.7 C) (Oral)   Resp 16   LMP 02/26/2020 (Approximate)   SpO2 100%   Physical Exam Vitals and nursing note reviewed.  Constitutional:      General: She is not in acute distress.    Appearance: She is not ill-appearing.     Comments: Patient is 54 year old female appears stated age.  Pleasant, able answer questions appropriately follow commands.  HENT:     Head: Normocephalic and atraumatic.     Nose: Nose normal.  Eyes:     General: No scleral icterus. Cardiovascular:     Rate and Rhythm: Normal rate and regular rhythm.     Pulses: Normal pulses.     Heart sounds: Normal heart sounds.  Pulmonary:     Effort: Pulmonary effort is normal. No respiratory distress.     Breath sounds: No wheezing.  Abdominal:     Palpations: Abdomen is soft.     Tenderness: There is no abdominal tenderness. There is no right CVA tenderness, left CVA tenderness, guarding or rebound.     Comments: Protuberant nondistended abdomen No significant palpation.  No guarding or rebound.  No CVA tenderness.  Musculoskeletal:     Cervical back: Normal range of motion.     Right lower leg: No edema.     Left lower leg: No edema.  Skin:    General: Skin is warm and dry.     Capillary Refill: Capillary refill takes less than 2 seconds.  Neurological:     Mental Status: She is alert. Mental status is at baseline.  Psychiatric:        Mood and Affect: Mood normal.        Behavior: Behavior normal.     ED Results / Procedures / Treatments   Labs (all labs ordered are listed, but only abnormal results are displayed) Labs Reviewed  CBC - Abnormal; Notable for the following components:      Result Value   Hemoglobin 11.1 (*)    HCT 35.9 (*)    MCV 74.8 (*)    MCH 23.1 (*)    RDW 21.7 (*)    All other components within normal limits  COMPREHENSIVE  METABOLIC PANEL - Abnormal; Notable for the following components:   Glucose, Bld 100 (*)  Calcium 8.8 (*)    Albumin 3.4 (*)    AST 12 (*)    All other components within normal limits  LIPASE, BLOOD  I-STAT BETA HCG BLOOD, ED ([facility], [facility], [facility] ONLY)  GC/CHLAMYDIA PROBE AMP ([place]) NOT AT [facility]    EKG None  Radiology No results found.  Procedures Procedures (including critical care time)  Medications Ordered in ED Medications  sodium chloride 0.9 % bolus 1,000 mL (0 mLs Intravenous Stopped 03/05/20 2127)  morphine 4 MG/ML injection 4 mg (4 mg Intravenous Given 03/05/20 2011)  dicyclomine (BENTYL) capsule 20 mg (20 mg Oral Given 03/05/20 2013)    ED Course  I have reviewed the triage vital signs and the nursing notes.  Pertinent labs & imaging results that were available during my care of the patient were reviewed by me and considered in my medical decision making (see chart for details).  Patient is a 54 year old female with a history of anemia,, anxiety, bipolar, depression, polyp/fibroid on ultrasound 12 years ago.  Patient presented today with vaginal bleeding for the past 3 days which has been severe.  She quantifies it as 7 pads before her arrival in the ED today and 3 pad since.  She has no significant drop in her hemoglobin today although no close comparison.  Her hemoglobin is 11.1 which is actually above her baseline established over the years in the past.  She appears well on exam it is notable that she has dark blood pooling in the vaginal vault however otherwise normal exam.  She is not orthostatic.  No tachycardia or hypotension.  Clinical Course as of Mar 06 2029  Fri Mar 05, 2020  1902 Discussed with Anyannwu of OBGYN who recommends 80 mg of megace and will arrange for follow up early next [facility] with [facility].   [WF]  2020 Informed by nursing staff that orthostatics were negative.  Patient is not tachycardic blood pressure is within normal limits.  She denies  any shortness of breath.     [WF]  2043 CMP without any acute abnormalities.CBC with very mild anemia which appears to be at baseline for patient.  Orthostatics within normal limits.  She is microcytic hypochromic consistent with iron deficiency anemia.  Will take OB/GYN recommendations to start patient on Megace 80 mg daily until she is seen in clinic.  [facility] OB/GYN will see patient in clinic.  I will personally recommend patient take iron supplements in the meantime.  Lipase within normal limits doubt pancreatitis.  hCG is negative for pregnancy.  Comprehensive metabolic panel(!) [WF]    Clinical Course User Index [WF] Tedd Sias, [place]     MDM Rules/Calculators/A&P                          Patient here for vaginal bleeding.  CBC with mild anemia however she is experiencing lightheadedness or dizziness.  Patient given Megace and states that she is no longer bleeding at time of discharge.  She is hemodynamically stable will discharge with close follow-up with OB/GYN.  They will call to ensure follow-up.  She is discharged with Megace 80 mg daily with a 40 mg tablet x2 per dose  Final Clinical Impression(s) / ED Diagnoses Final diagnoses:  Vaginal bleeding    Rx / DC Orders ED Discharge Orders         Ordered    megestrol (MEGACE) 40 MG tablet  Daily     Discontinue  Reprint  03/05/20 2059           Tedd Sias, PA 03/06/20 2032    Maudie Flakes, MD 03/11/20 [phone]

## 2020-03-05 NOTE — ED Triage Notes (Signed)
[facility] for c/o heavy vaginal bleeding, states bleeding started 3 days ago, initially very light then became heavy with clots today. LMP was last week. Reports STD testing at [facility] today. EMS VS: 147/114, HR 84, o2 100% on ra, cbg 130. A/ox4.

## 2020-03-05 NOTE — Discharge Instructions (Addendum)
Your work-up today was reassuring.  You were prescribed Megace which is a medication for vaginal bleeding which will help.  Ultimately you need to be seen by an OB/GYN doctor as you may need some form of procedure to be done over the very least be started on medication.  In addition to taking the Megace I recommend that you start a women's multivitamin that contains iron.  Please drink plenty of water.  The OBGYN will reach out to you in the near future to establish care. I have also included the information for the women's clinic which you may call.

## 2020-03-08 LAB — GC/CHLAMYDIA PROBE AMP (~~LOC~~) NOT AT ARMC
Chlamydia: NEGATIVE
Comment: NEGATIVE
Comment: NORMAL
Neisseria Gonorrhea: NEGATIVE

## 2020-03-23 ENCOUNTER — Other Ambulatory Visit: Payer: Self-pay | Admitting: *Deleted

## 2020-03-23 DIAGNOSIS — N939 Abnormal uterine and vaginal bleeding, unspecified: Secondary | ICD-10-CM

## 2020-03-25 ENCOUNTER — Encounter: Payer: Self-pay | Admitting: Obstetrics and Gynecology

## 2020-03-25 ENCOUNTER — Other Ambulatory Visit (HOSPITAL_COMMUNITY)
Admission: RE | Admit: 2020-03-25 | Discharge: 2020-03-25 | Disposition: A | Discharge status: Home / Self Care | Payer: Medicaid Other | Source: Ambulatory Visit | Attending: Obstetrics and Gynecology | Admitting: Obstetrics and Gynecology

## 2020-03-25 ENCOUNTER — Other Ambulatory Visit: Payer: Self-pay

## 2020-03-25 ENCOUNTER — Ambulatory Visit (INDEPENDENT_AMBULATORY_CARE_PROVIDER_SITE_OTHER): Payer: Medicaid Other | Admitting: Obstetrics and Gynecology

## 2020-03-25 VITALS — BP 145/97 | HR 79 | Wt 171.5 lb

## 2020-03-25 DIAGNOSIS — Z30013 Encounter for initial prescription of injectable contraceptive: Secondary | ICD-10-CM | POA: Diagnosis not present

## 2020-03-25 DIAGNOSIS — Z1231 Encounter for screening mammogram for malignant neoplasm of breast: Secondary | ICD-10-CM

## 2020-03-25 DIAGNOSIS — Z124 Encounter for screening for malignant neoplasm of cervix: Secondary | ICD-10-CM | POA: Diagnosis not present

## 2020-03-25 DIAGNOSIS — N920 Excessive and frequent menstruation with regular cycle: Secondary | ICD-10-CM | POA: Insufficient documentation

## 2020-03-25 MED ORDER — MEDROXYPROGESTERONE ACETATE 150 MG/ML IM SUSY: 150.0000 mg | PREFILLED_SYRINGE | INTRAMUSCULAR | 4 refills | Status: AC

## 2020-03-25 MED ORDER — MEDROXYPROGESTERONE ACETATE 150 MG/ML IM SUSP
150.0000 mg | Freq: Once | INTRAMUSCULAR | Status: AC
  Administered 2020-03-25: 150 mg via INTRAMUSCULAR

## 2020-03-25 NOTE — Progress Notes (Signed)
54 yo P5 with LMP 02/26/20 presenting today as an ED follow up for the management of DUB. Patient was seen 3 weeks ago in the ED with complaints of menorrhagia. She was given a prescription for megace which she took reporting minimal improvement.  Patient reports a monthly period lasting 7 days. She denies pelvic pain. Patient is sexually active without complaints. She denies abnormal discharge.   Past Medical History:  Diagnosis Date   Anemia    Anxiety    Asthma    Back problem    Bipolar 1 disorder ([place])    Cocaine abuse ([place])    Depression    Schizophrenia ([place])    Past Surgical History:  Procedure Laterality Date   TUBAL LIGATION  1992   Family History  Problem Relation Age of Onset   Diabetes Mother    Kidney disease Mother        kidney failure   Thrombocytopenia Mother    Depression Father    Alcohol abuse Father    Mental illness Father    Heart failure Son    Social History   Tobacco Use   Smoking status: Current Every Day Smoker    Packs/day: 0.50    Years: 30.00    Pack years: 15.00    Types: Cigarettes   Smokeless tobacco: Never Used  Substance Use Topics   Alcohol use: Yes    Comment: socially   Drug use: Yes    Frequency: 1.0 times per week    Types: Marijuana, Cocaine    Comment: marijuana   ROS See pertinent in HPI  Blood pressure (!) 145/97, pulse 79, weight 171 lb 8 oz (77.8 kg), last menstrual period 02/26/2020. GENERAL: Well-developed, well-nourished female in no acute distress.  LUNGS: Clear to auscultation bilaterally.  HEART: Regular rate and rhythm. BREASTS: Symmetric in size. No palpable masses or lymphadenopathy, skin changes, or nipple drainage. ABDOMEN: Soft, nontender, nondistended. No organomegaly. PELVIC: Normal external female genitalia. Vagina is pink and rugated.  Normal discharge. Normal appearing cervix. Uterus is normal in size. No adnexal mass or tenderness. EXTREMITIES: No cyanosis, clubbing, or edema,  2+ distal pulses.  A/P 54 yo with menorrhagia - Pap smear collected - Screening mammogram ordered - Discussed benefits of endometrial biospy ENDOMETRIAL BIOPSY     The indications for endometrial biopsy were reviewed.   Risks of the biopsy including cramping, bleeding, infection, uterine perforation, inadequate specimen and need for additional procedures  were discussed. The patient states she understands and agrees to undergo procedure today. Consent was signed. Time out was performed. Urine HCG was negative. A sterile speculum was placed in the patient's vagina and the cervix was prepped with Betadine. A single-toothed tenaculum was placed on the anterior lip of the cervix to stabilize it. The uterine cavity was sounded to a depth of 9 cm using the uterine sound. The 3 mm pipelle was introduced into the endometrial cavity without difficulty, 2 passes were made.  A  moderate amount of tissue was  sent to pathology. The instruments were removed from the patient's vagina. Minimal bleeding from the cervix was noted. The patient tolerated the procedure well.  Routine post-procedure instructions were given to the patient. The patient will follow up in two weeks to review the results and for further management.  - Discussed medical management with depo-provera or surgical management with endometrial ablation pending biospy results. Patient agreed to depo-provera today - Patient will be contacted with results

## 2020-03-25 NOTE — Patient Instructions (Signed)
Endometrial Ablation  Endometrial ablation is a procedure that destroys the thin inner layer of the lining of the uterus (endometrium). This procedure may be done:  To stop heavy periods.  To stop bleeding that is causing anemia.  To control irregular bleeding.  To treat bleeding caused by small tumors (fibroids) in the endometrium.  This procedure is often an alternative to major surgery, such as removal of the uterus and cervix (hysterectomy). As a result of this procedure:  You may not be able to have children. However, if you are premenopausal (you have not gone through menopause):  You may still have a small chance of getting pregnant.  You will need to use a reliable method of birth control after the procedure to prevent pregnancy.  You may stop having a menstrual period, or you may have only a small amount of bleeding during your period. Menstruation may return several years after the procedure.  Tell a health care provider about:  Any allergies you have.  All medicines you are taking, including vitamins, herbs, eye drops, creams, and over-the-counter medicines.  Any problems you or family members have had with the use of anesthetic medicines.  Any blood disorders you have.  Any surgeries you have had.  Any medical conditions you have.  What are the risks?  Generally, this is a safe procedure. However, problems may occur, including:  A hole (perforation) in the uterus or bowel.  Infection of the uterus, bladder, or vagina.  Bleeding.  Damage to other structures or organs.  An air bubble in the lung (air embolus).  Problems with pregnancy after the procedure.  Failure of the procedure.  Decreased ability to diagnose cancer in the endometrium.  What happens before the procedure?  You will have tests of your endometrium to make sure there are no pre-cancerous cells or cancer cells present.  You may have an ultrasound of the uterus.  You may be given medicines to thin the endometrium.  Ask your health care  provider about:  Changing or stopping your regular medicines. This is especially important if you take diabetes medicines or blood thinners.  Taking medicines such as aspirin and ibuprofen. These medicines can thin your blood. Do not take these medicines before your procedure if your doctor tells you not to.  Plan to have someone take you home from the hospital or clinic.  What happens during the procedure?    You will lie on an exam table with your feet and legs supported as in a pelvic exam.  To lower your risk of infection:  Your health care team will wash or sanitize their hands and put on germ-free (sterile) gloves.  Your genital area will be washed with soap.  An IV tube will be inserted into one of your veins.  You will be given a medicine to help you relax (sedative).  A surgical instrument with a light and camera (resectoscope) will be inserted into your vagina and moved into your uterus. This allows your surgeon to see inside your uterus.  Endometrial tissue will be removed using one of the following methods:  Radiofrequency. This method uses a radiofrequency-alternating electric current to remove the endometrium.  Cryotherapy. This method uses extreme cold to freeze the endometrium.  Heated-free liquid. This method uses a heated saltwater (saline) solution to remove the endometrium.  Microwave. This method uses high-energy microwaves to heat up the endometrium and remove it.  Thermal balloon. This method involves inserting a catheter with a balloon tip into   the uterus. The balloon tip is filled with heated fluid to remove the endometrium.  The procedure may vary among health care providers and hospitals.  What happens after the procedure?  Your blood pressure, heart rate, breathing rate, and blood oxygen level will be monitored until the medicines you were given have worn off.  As tissue healing occurs, you may notice vaginal bleeding for 4-6 weeks after the procedure. You may also  experience:  Cramps.  Thin, watery vaginal discharge that is light pink or brown in color.  A need to urinate more frequently than usual.  Nausea.  Do not drive for 24 hours if you were given a sedative.  Do not have sex or insert anything into your vagina until your health care provider approves.  Summary  Endometrial ablation is done to treat the many causes of heavy menstrual bleeding.  The procedure may be done only after medications have been tried to control the bleeding.  Plan to have someone take you home from the hospital or clinic.  This information is not intended to replace advice given to you by your health care provider. Make sure you discuss any questions you have with your health care provider.  Document Revised: 02/12/2018 Document Reviewed: 09/14/2016  Elsevier Patient Education  2020 Elsevier Inc.

## 2020-03-26 ENCOUNTER — Telehealth (INDEPENDENT_AMBULATORY_CARE_PROVIDER_SITE_OTHER): Payer: Medicaid Other

## 2020-03-26 DIAGNOSIS — Z9289 Personal history of other medical treatment: Secondary | ICD-10-CM

## 2020-03-26 DIAGNOSIS — Z9889 Other specified postprocedural states: Secondary | ICD-10-CM

## 2020-03-26 NOTE — Telephone Encounter (Addendum)
Pt called and wanted to know if she can have sex.  I called pt and per chart review pt had an endo bx yesterday.  I advised pt to wait at least three days before having sex however if she wants to have sex then that is up to her.  Pt verbalized understanding.   Mel Almond, RN

## 2020-03-29 ENCOUNTER — Ambulatory Visit
Admission: RE | Admit: 2020-03-29 | Discharge: 2020-03-29 | Disposition: A | Discharge status: Home / Self Care | Payer: Medicaid Other | Source: Ambulatory Visit | Attending: Obstetrics & Gynecology | Admitting: Obstetrics & Gynecology

## 2020-03-29 ENCOUNTER — Other Ambulatory Visit: Payer: Self-pay

## 2020-03-29 DIAGNOSIS — N939 Abnormal uterine and vaginal bleeding, unspecified: Secondary | ICD-10-CM

## 2020-03-29 LAB — SURGICAL PATHOLOGY

## 2020-04-01 ENCOUNTER — Telehealth (INDEPENDENT_AMBULATORY_CARE_PROVIDER_SITE_OTHER): Payer: Medicaid Other

## 2020-04-01 DIAGNOSIS — R9389 Abnormal findings on diagnostic imaging of other specified body structures: Secondary | ICD-10-CM

## 2020-04-01 DIAGNOSIS — D259 Leiomyoma of uterus, unspecified: Secondary | ICD-10-CM

## 2020-04-01 DIAGNOSIS — N938 Other specified abnormal uterine and vaginal bleeding: Secondary | ICD-10-CM

## 2020-04-01 NOTE — Telephone Encounter (Addendum)
-----   Message from Osborne Oman, MD sent at 03/29/2020  4:26 PM EDT ----- Small fibroids. Thickened endometrial stripe which is abnormal given her age, will follow up biopsy results.  Please call to inform patient of results and recommendations.  Called pt and notified pt her U/S results and endo bx results.  Pt verbalized understanding.   Mel Almond, RN  04/01/20

## 2020-04-02 LAB — CYTOLOGY - PAP
Comment: NEGATIVE
Diagnosis: NEGATIVE
High risk HPV: NEGATIVE

## 2020-04-05 ENCOUNTER — Telehealth: Payer: Self-pay | Admitting: Lactation Services

## 2020-04-05 NOTE — Telephone Encounter (Signed)
Attempted to call patient, no answer on home phone and no answering machine picked up.   Attempted cell number listed and received a message that phone not in service.   Will try once more and then send letter.

## 2020-04-05 NOTE — Telephone Encounter (Signed)
-----   Message from Mora Bellman, MD sent at 03/29/2020  8:43 AM EDT ----- Please inform patient of negative endometrial biopsy for cancer  Thanks  Peggy

## 2020-04-06 NOTE — Telephone Encounter (Signed)
Pt has been notified of results from prior telephone encounter.   Mel Almond, RN

## 2020-04-14 ENCOUNTER — Ambulatory Visit: Payer: Medicaid Other

## 2020-04-21 ENCOUNTER — Ambulatory Visit: Payer: Medicaid Other | Admitting: Obstetrics & Gynecology

## 2020-04-26 ENCOUNTER — Telehealth: Payer: Self-pay

## 2020-04-26 NOTE — Telephone Encounter (Signed)
Pt called with c/o having second week of bleeding and it is not acceptable.    Mel Almond, RN

## 2020-04-27 ENCOUNTER — Ambulatory Visit: Payer: Medicaid Other | Attending: Internal Medicine

## 2020-04-27 DIAGNOSIS — Z23 Encounter for immunization: Secondary | ICD-10-CM

## 2020-04-27 NOTE — Progress Notes (Signed)
   Covid-19 Vaccination Clinic  Name:  Samantha Nash    MRN: [id number] DOB: 03-Sep-1966  04/27/2020  [name] was observed post Covid-19 immunization for 15 minutes without incident. She was provided with Vaccine Information Sheet and instruction to access the V-Safe system.   [name] was instructed to call 911 with any severe reactions post vaccine: Marland Kitchen Difficulty breathing  . Swelling of face and throat  . A fast heartbeat  . A bad rash all over body  . Dizziness and weakness   Immunizations Administered    Name Date Dose VIS Date Route   Moderna COVID-19 Vaccine 04/27/2020 11:55 AM 0.5 mL 08/2019 Intramuscular   Manufacturer: Moderna   Lot: [id number]   [place]: [id number]

## 2020-04-28 ENCOUNTER — Ambulatory Visit: Payer: Medicaid Other

## 2020-04-29 NOTE — Telephone Encounter (Signed)
Attempted to call Mobile Number, message received that it is not in service. Attempted to call home phone Number, received message that call could not be completed at this time.

## 2020-04-30 NOTE — Telephone Encounter (Signed)
Attempted to call patient and no answer on either number, mychart not active.

## 2020-05-25 ENCOUNTER — Ambulatory Visit: Payer: Medicaid Other

## 2020-06-07 ENCOUNTER — Ambulatory Visit (INDEPENDENT_AMBULATORY_CARE_PROVIDER_SITE_OTHER): Payer: Medicaid Other | Admitting: Obstetrics and Gynecology

## 2020-06-07 ENCOUNTER — Other Ambulatory Visit: Payer: Self-pay

## 2020-06-07 ENCOUNTER — Encounter: Payer: Self-pay | Admitting: Obstetrics and Gynecology

## 2020-06-07 ENCOUNTER — Other Ambulatory Visit (HOSPITAL_COMMUNITY)
Admission: RE | Admit: 2020-06-07 | Discharge: 2020-06-07 | Disposition: A | Discharge status: Home / Self Care | Payer: Medicaid Other | Source: Ambulatory Visit | Attending: Obstetrics and Gynecology | Admitting: Obstetrics and Gynecology

## 2020-06-07 VITALS — BP 133/88 | HR 85 | Wt 161.4 lb

## 2020-06-07 DIAGNOSIS — N939 Abnormal uterine and vaginal bleeding, unspecified: Secondary | ICD-10-CM

## 2020-06-07 DIAGNOSIS — N760 Acute vaginitis: Secondary | ICD-10-CM

## 2020-06-07 DIAGNOSIS — Z113 Encounter for screening for infections with a predominantly sexual mode of transmission: Secondary | ICD-10-CM | POA: Diagnosis not present

## 2020-06-07 NOTE — Patient Instructions (Signed)
Endometrial Ablation  Endometrial ablation is a procedure that destroys the thin inner layer of the lining of the uterus (endometrium). This procedure may be done:  To stop heavy periods.  To stop bleeding that is causing anemia.  To control irregular bleeding.  To treat bleeding caused by small tumors (fibroids) in the endometrium.  This procedure is often an alternative to major surgery, such as removal of the uterus and cervix (hysterectomy). As a result of this procedure:  You may not be able to have children. However, if you are premenopausal (you have not gone through menopause):  You may still have a small chance of getting pregnant.  You will need to use a reliable method of birth control after the procedure to prevent pregnancy.  You may stop having a menstrual period, or you may have only a small amount of bleeding during your period. Menstruation may return several years after the procedure.  Tell a health care provider about:  Any allergies you have.  All medicines you are taking, including vitamins, herbs, eye drops, creams, and over-the-counter medicines.  Any problems you or family members have had with the use of anesthetic medicines.  Any blood disorders you have.  Any surgeries you have had.  Any medical conditions you have.  What are the risks?  Generally, this is a safe procedure. However, problems may occur, including:  A hole (perforation) in the uterus or bowel.  Infection of the uterus, bladder, or vagina.  Bleeding.  Damage to other structures or organs.  An air bubble in the lung (air embolus).  Problems with pregnancy after the procedure.  Failure of the procedure.  Decreased ability to diagnose cancer in the endometrium.  What happens before the procedure?  You will have tests of your endometrium to make sure there are no pre-cancerous cells or cancer cells present.  You may have an ultrasound of the uterus.  You may be given medicines to thin the endometrium.  Ask your health care  provider about:  Changing or stopping your regular medicines. This is especially important if you take diabetes medicines or blood thinners.  Taking medicines such as aspirin and ibuprofen. These medicines can thin your blood. Do not take these medicines before your procedure if your doctor tells you not to.  Plan to have someone take you home from the hospital or clinic.  What happens during the procedure?    You will lie on an exam table with your feet and legs supported as in a pelvic exam.  To lower your risk of infection:  Your health care team will wash or sanitize their hands and put on germ-free (sterile) gloves.  Your genital area will be washed with soap.  An IV tube will be inserted into one of your veins.  You will be given a medicine to help you relax (sedative).  A surgical instrument with a light and camera (resectoscope) will be inserted into your vagina and moved into your uterus. This allows your surgeon to see inside your uterus.  Endometrial tissue will be removed using one of the following methods:  Radiofrequency. This method uses a radiofrequency-alternating electric current to remove the endometrium.  Cryotherapy. This method uses extreme cold to freeze the endometrium.  Heated-free liquid. This method uses a heated saltwater (saline) solution to remove the endometrium.  Microwave. This method uses high-energy microwaves to heat up the endometrium and remove it.  Thermal balloon. This method involves inserting a catheter with a balloon tip into   the uterus. The balloon tip is filled with heated fluid to remove the endometrium.  The procedure may vary among health care providers and hospitals.  What happens after the procedure?  Your blood pressure, heart rate, breathing rate, and blood oxygen level will be monitored until the medicines you were given have worn off.  As tissue healing occurs, you may notice vaginal bleeding for 4-6 weeks after the procedure. You may also  experience:  Cramps.  Thin, watery vaginal discharge that is light pink or brown in color.  A need to urinate more frequently than usual.  Nausea.  Do not drive for 24 hours if you were given a sedative.  Do not have sex or insert anything into your vagina until your health care provider approves.  Summary  Endometrial ablation is done to treat the many causes of heavy menstrual bleeding.  The procedure may be done only after medications have been tried to control the bleeding.  Plan to have someone take you home from the hospital or clinic.  This information is not intended to replace advice given to you by your health care provider. Make sure you discuss any questions you have with your health care provider.  Document Revised: 02/12/2018 Document Reviewed: 09/14/2016  Elsevier Patient Education  2020 Elsevier Inc.

## 2020-06-07 NOTE — Progress Notes (Signed)
54 yo P5 here for further management of menorrhagia. Patient with a monthly 7 day period heavy in flow. Patient received first dose of depo-provera in July and reports 2 week history of vaginal bleeding and decrease in appetite. Patient is not interested in continuing with depo-provera. Patient desires STI testing today. Patient also reports vaginitis and concerns for a yeast infection  Past Medical History:  Diagnosis Date  . Anemia   . Anxiety   . Asthma   . Back problem   . Bipolar 1 disorder ([place])   . Cocaine abuse ([place])   . Depression   . Schizophrenia [facility])    Past Surgical History:  Procedure Laterality Date  . TUBAL LIGATION  1992   Family History  Problem Relation Age of Onset  . Diabetes Mother   . Kidney disease Mother        kidney failure  . Thrombocytopenia Mother   . Depression Father   . Alcohol abuse Father   . Mental illness Father   . Heart failure Son    Social History   Tobacco Use  . Smoking status: Current Every Day Smoker    Packs/day: 0.50    Years: 30.00    Pack years: 15.00    Types: Cigarettes  . Smokeless tobacco: Never Used  Substance Use Topics  . Alcohol use: Yes    Comment: socially  . Drug use: Yes    Frequency: 1.0 times per week    Types: Marijuana, Cocaine    Comment: marijuana   ROS See pertinent in HPI. All other systems reviewed and negative  Blood pressure 133/88, pulse 85, weight 161 lb 6.4 oz (73.2 kg). GENERAL: Well-developed, well-nourished female in no acute distress.  ABDOMEN: Soft, nontender, nondistended. No organomegaly. PELVIC: Normal external female genitalia. Vagina is pink and rugated.  Normal discharge. Normal appearing cervix. Uterus is 10-weeks in size.  No adnexal mass or tenderness. EXTREMITIES: No cyanosis, clubbing, or edema, 2+ distal pulses.  A/P 54 yo with DUB - Normal endometrial biopsy 03/2020 - Discussed surgical management with endometrial ablation. Patient desires to be scheduled - STI  testing per patient request - Patient will be contacted with abnormal results

## 2020-06-07 NOTE — Progress Notes (Signed)
Pt is in the office to discuss getting a partial hysterectomy, pt states that she has been bleeding since June Pt also wants full std panel today, also complains of pain on her sides and frequent urination, advised to leave urine sample

## 2020-06-08 ENCOUNTER — Telehealth: Payer: Self-pay

## 2020-06-08 LAB — CERVICOVAGINAL ANCILLARY ONLY
Bacterial Vaginitis (gardnerella): POSITIVE — AB
Candida Glabrata: NEGATIVE
Candida Vaginitis: NEGATIVE
Chlamydia: NEGATIVE
Comment: NEGATIVE
Comment: NEGATIVE
Comment: NEGATIVE
Comment: NEGATIVE
Comment: NEGATIVE
Comment: NORMAL
Neisseria Gonorrhea: NEGATIVE
Trichomonas: POSITIVE — AB

## 2020-06-08 LAB — RPR: RPR Ser Ql: NONREACTIVE

## 2020-06-08 LAB — HEPATITIS C ANTIBODY: Hep C Virus Ab: <0.1 {s_co_ratio} (ref 0.0–0.9)

## 2020-06-08 LAB — HIV ANTIBODY (ROUTINE TESTING W REFLEX): HIV Screen 4th Generation wRfx: NONREACTIVE

## 2020-06-08 LAB — HEPATITIS B SURFACE ANTIGEN: Hepatitis B Surface Ag: NEGATIVE

## 2020-06-08 NOTE — Telephone Encounter (Signed)
TC from pt wants to know if she is a good candidate for Depo Lupron instead of the Ablation.  She said her daughter has hx of heavy bleeding with endometriosis and it worked well for her.  Pt aware I will consult with her doctor

## 2020-06-09 LAB — URINE CULTURE

## 2020-06-10 ENCOUNTER — Ambulatory Visit: Payer: Medicaid Other

## 2020-06-10 NOTE — Telephone Encounter (Addendum)
After consulting with provider, pt needs an appointment to discuss Depo Lupron.  Unable to VM - VM not set up

## 2020-06-10 NOTE — Telephone Encounter (Signed)
Pt returned call Depo Lupron approved  Order faxed today We will contact patient for an appt once we receive the injection Pt agrees and has no further questions

## 2020-06-11 ENCOUNTER — Other Ambulatory Visit (HOSPITAL_COMMUNITY): Payer: Medicaid Other

## 2020-06-11 ENCOUNTER — Telehealth: Payer: Self-pay

## 2020-06-11 NOTE — Telephone Encounter (Signed)
Rec'vd VM pt calling to request recent test results. Unable to reach pt; VM not set up

## 2020-06-12 ENCOUNTER — Inpatient Hospital Stay (HOSPITAL_COMMUNITY): Admission: RE | Admit: 2020-06-12 | Payer: Medicaid Other | Source: Ambulatory Visit

## 2020-06-14 MED ORDER — METRONIDAZOLE 500 MG PO TABS: 500.0000 mg | ORAL_TABLET | Freq: Two times a day (BID) | ORAL | 0 refills | Status: AC

## 2020-06-14 MED ORDER — NITROFURANTOIN MONOHYD MACRO 100 MG PO CAPS
100.0000 mg | ORAL_CAPSULE | Freq: Two times a day (BID) | ORAL | 1 refills | Status: AC
Start: 2020-06-14 — End: ?

## 2020-06-14 NOTE — Progress Notes (Signed)
Sent epic inbasket note to dr constant patient had covid test scheduled Friday 06-11-20 and again 06-12-20 thru [facility] worker erica watkins , pt did not show for either covid test and surgery needs to be rescheduled at least a week out due to transportation issues.

## 2020-06-14 NOTE — Addendum Note (Signed)
Addended by: Mora Bellman on: 06/14/2020 01:23 PM   Modules accepted: Orders

## 2020-06-15 ENCOUNTER — Ambulatory Visit (HOSPITAL_BASED_OUTPATIENT_CLINIC_OR_DEPARTMENT_OTHER)
Admission: RE | Admit: 2020-06-15 | Payer: Medicaid Other | Source: Home / Self Care | Admitting: Obstetrics and Gynecology

## 2020-06-15 SURGERY — DILATATION & CURETTAGE/HYSTEROSCOPY WITH HYDROTHERMAL ABLATION
Anesthesia: Choice

## 2020-06-29 ENCOUNTER — Telehealth: Payer: Self-pay

## 2020-06-29 NOTE — Telephone Encounter (Signed)
TC to [employer] to f/u on pt Depo Lupro Rx status  First call was made to [phone] opt 1  Spoke with a rep then was transferred to another rep who then gave me a number to f/o with a pharm At [phone] Spoke with rep Rx was still in INS process per rep Rep made aware this Rx was approved and everything was faxed on 06/10/20  Pt has been waiting on Rx since 06/10/20 Stated they now need pt consent to send Rx.

## 2020-07-02 ENCOUNTER — Telehealth: Payer: Self-pay

## 2020-07-02 ENCOUNTER — Ambulatory Visit: Payer: Medicaid Other | Admitting: Obstetrics and Gynecology

## 2020-07-02 NOTE — Telephone Encounter (Addendum)
Received coorpondonce from [facility] been attempting to reach pt in regards to scheduling the delivery of Depo Lupron to the office  The medication will be placed on hold until patient calls [facility] [phone]  I tried and was unable to reach pt as well - VM not set up

## 2021-03-18 IMAGING — US US PELVIS COMPLETE WITH TRANSVAGINAL
1 series · 15 of 25 positions shown · non-contrast
Comparison: Report 09/23/2010

CLINICAL DATA: Abnormal vaginal bleeding

EXAM:
TRANSABDOMINAL AND TRANSVAGINAL ULTRASOUND OF PELVIS
TECHNIQUE: Both transabdominal and transvaginal ultrasound examinations of the
pelvis were performed. Transabdominal technique was performed for
global imaging of the pelvis including uterus, ovaries, adnexal
regions, and pelvic cul-de-sac. It was necessary to proceed with
endovaginal exam following the transabdominal exam to visualize the
uterus endometrium ovaries.

[Series 1: us pelvis complete with transvaginal · 15 of 60 slices shown]
[im 1/60]
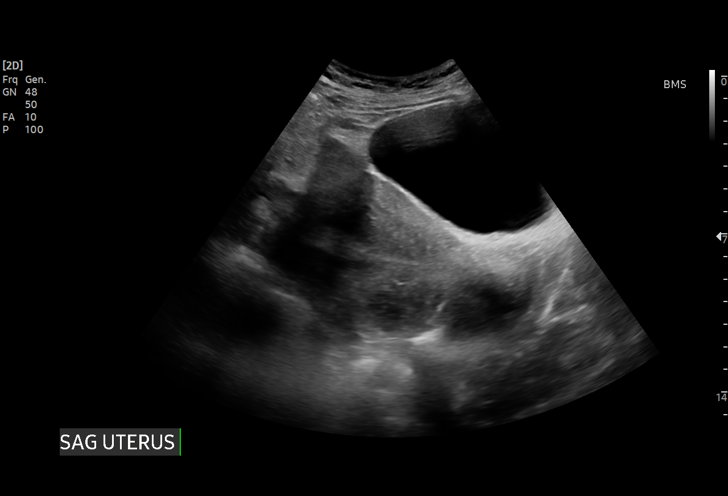
[im 5/60]
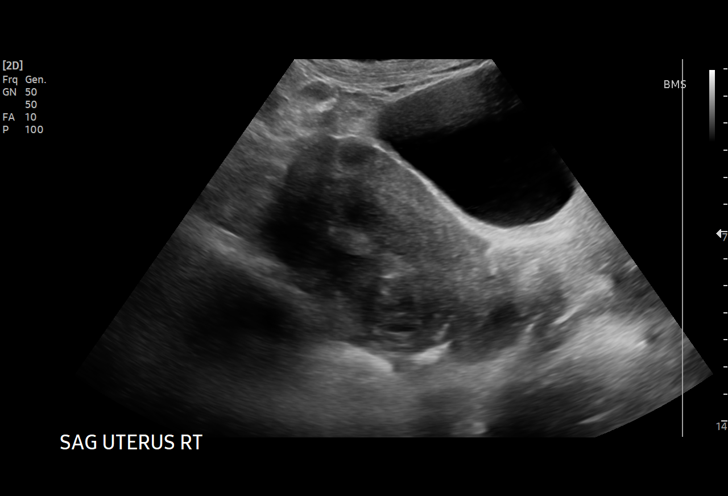
[im 10/60]
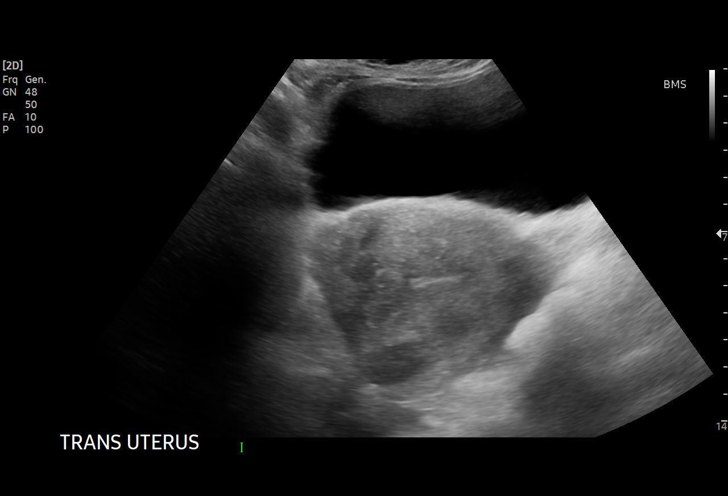
[im 13/60]
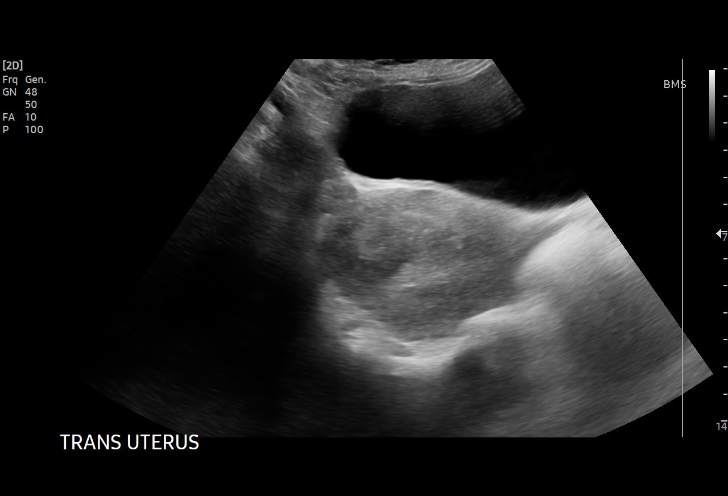
[im 18/60]
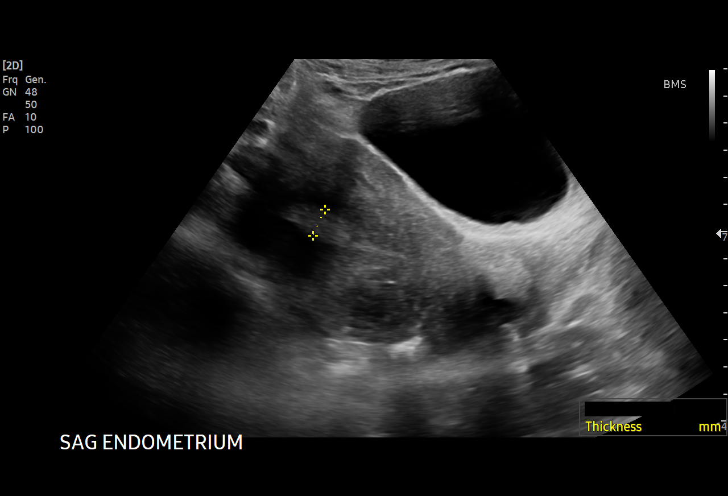
[im 23/60]
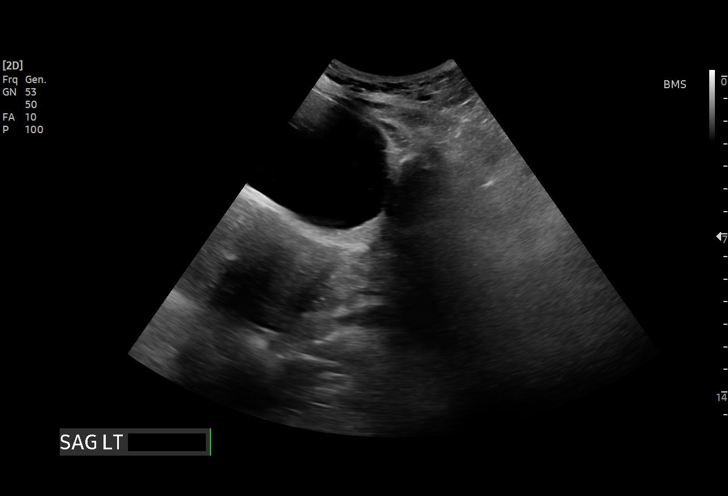
[im 25/60]
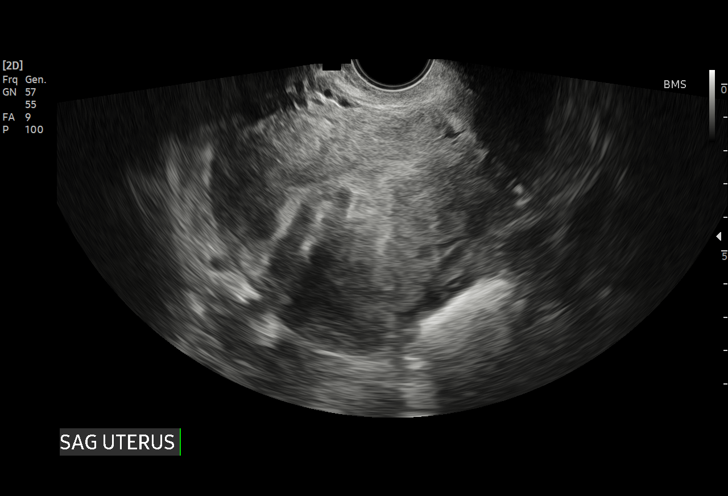
[im 30/60]
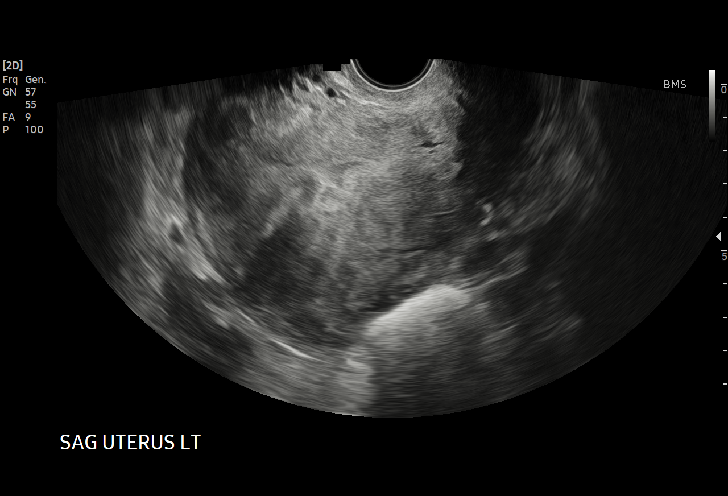
[im 35/60]
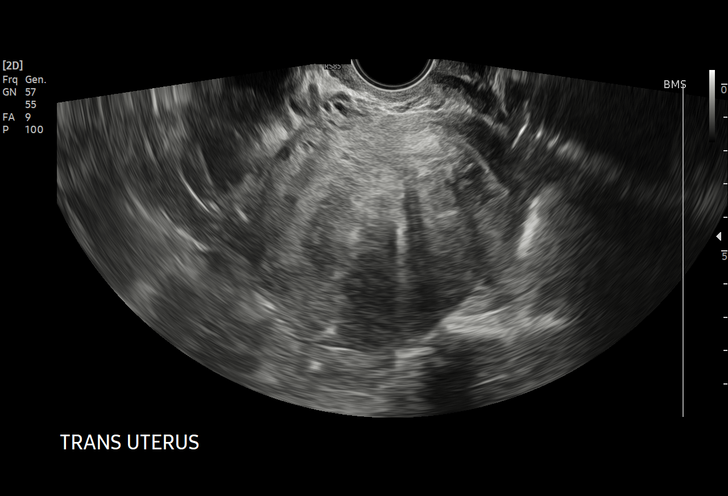
[im 37/60]
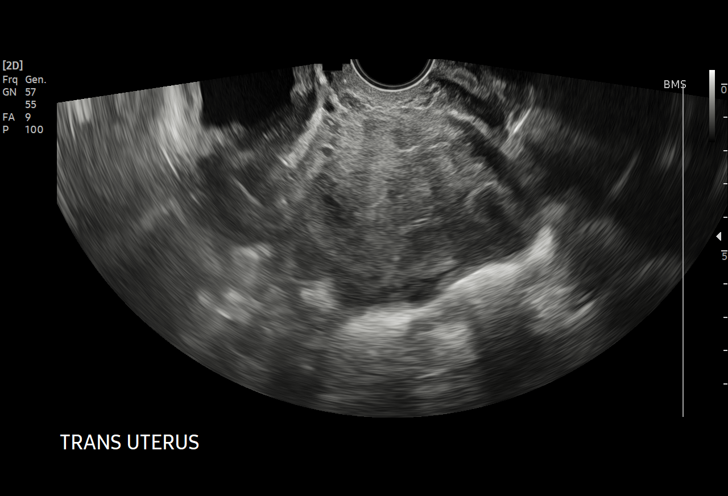
[im 42/60]
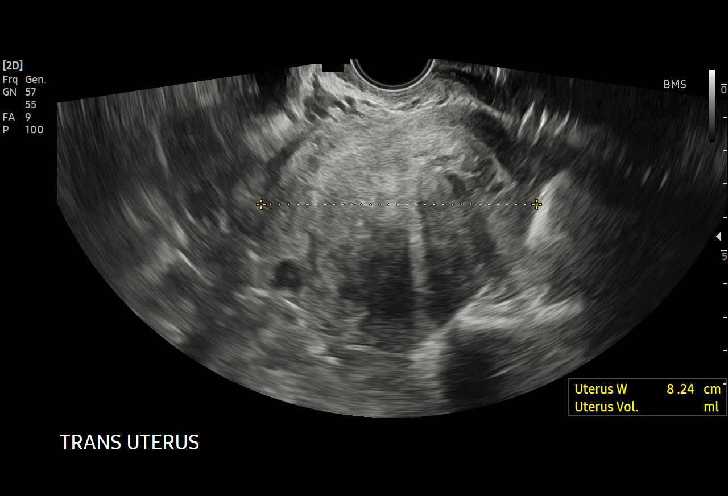
[im 47/60]
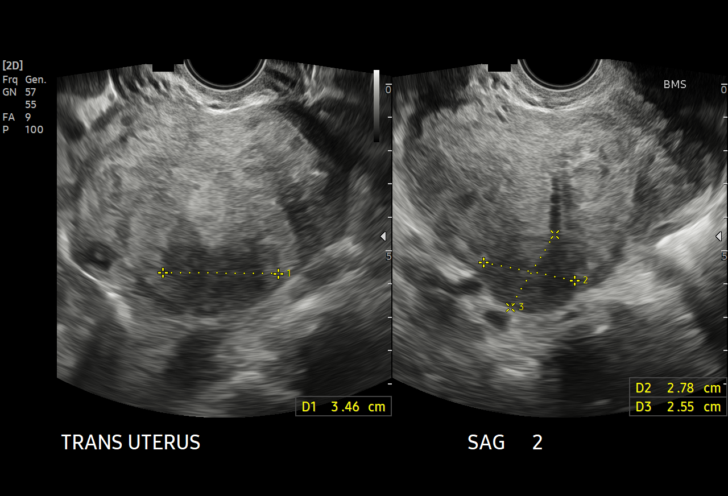
[im 50/60]
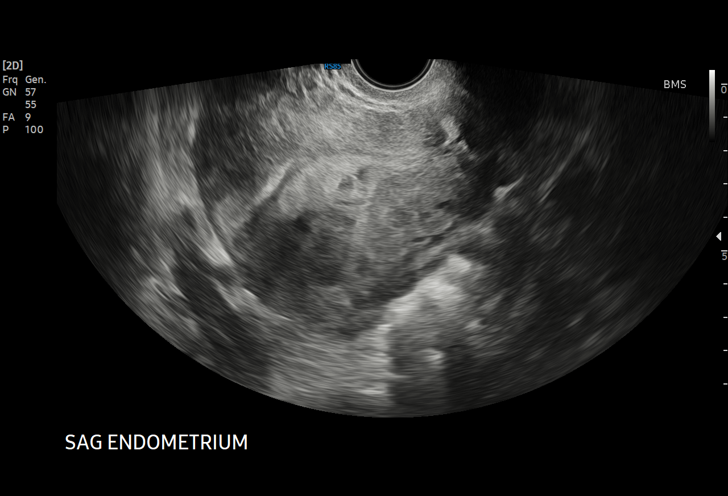
[im 55/60]
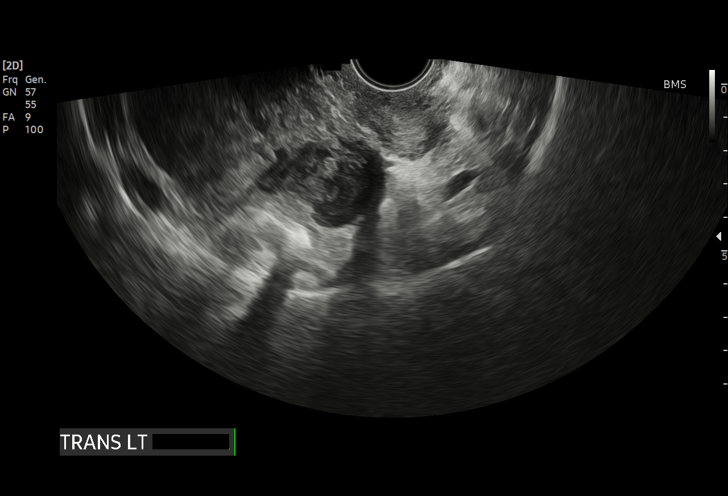
[im 60/60]
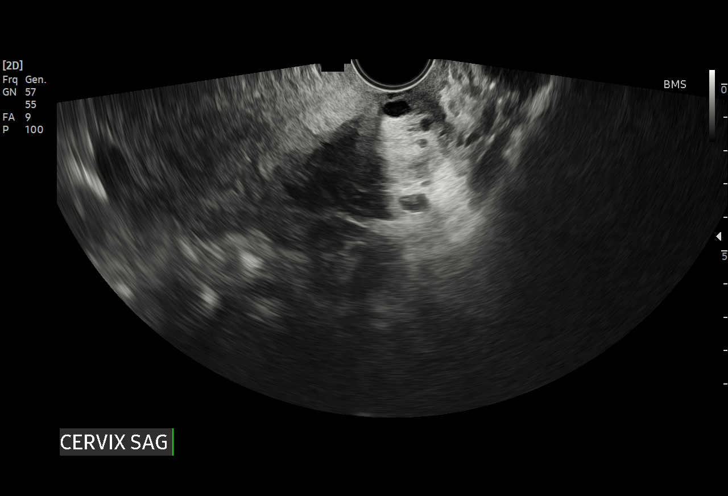

[15 of 25 positions shown; findings below may reference images not displayed]

FINDINGS: Uterus

Measurements: 11.9 x 7.2 x 8.4 cm = volume: 373.2 mL. At least 2
measurable myometrial masses. A posterior fundal subserosal mass
measuring 2.8 by 2.6 x 3.5 cm. Left lower uterine segment mildly
exophytic mass measuring 3.7 x 2.4 x 2.9 cm.

Endometrium

Thickness: 13.4 mm. Endometrium is heterogenous and contains
scattered hypoechoic areas indeterminate for small cysts or nodules.

Right ovary

Not seen

Left ovary

Not seen

Other findings

No abnormal free fluid.
IMPRESSION: 1. Heterogenous appearance of the endometrium with scattered
hypoechoic areas indeterminate for small cysts or nodules. Consider
further evaluation with sonohysterogram for confirmation prior to
hysteroscopy. Endometrial sampling should also be considered if
patient is at high risk for endometrial carcinoma. (Ref:
Radiological Reasoning: Algorithmic Workup of Abnormal Vaginal
Bleeding with Endovaginal Sonography and Sonohysterography. AJR
3222; 191:S68-73)
2. Uterine fibroids
